# Patient Record
Sex: Male | Born: 1985 | Race: White | Hispanic: No | Smoking: Never smoker
Health system: Southern US, Community
[De-identification: ages and names within clinical notes are randomized; demographics above are authoritative.]

## PROBLEM LIST (undated history)

## (undated) DIAGNOSIS — E079 Disorder of thyroid, unspecified: Secondary | ICD-10-CM

---
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
## 2008-06-25 ENCOUNTER — Emergency Department (HOSPITAL_COMMUNITY): Admission: EM | Admit: 2008-06-25 | Discharge: 2008-06-25 | Payer: Self-pay | Admitting: Emergency Medicine

## 2010-12-24 ENCOUNTER — Emergency Department (HOSPITAL_BASED_OUTPATIENT_CLINIC_OR_DEPARTMENT_OTHER): Payer: Self-pay

## 2010-12-24 ENCOUNTER — Encounter: Payer: Self-pay | Admitting: *Deleted

## 2010-12-24 ENCOUNTER — Emergency Department (HOSPITAL_BASED_OUTPATIENT_CLINIC_OR_DEPARTMENT_OTHER)
Admission: EM | Admit: 2010-12-24 | Discharge: 2010-12-24 | Disposition: A | Discharge status: Home / Self Care | Payer: Self-pay | Attending: Emergency Medicine | Admitting: Emergency Medicine

## 2010-12-24 DIAGNOSIS — R05 Cough: Secondary | ICD-10-CM | POA: Insufficient documentation

## 2010-12-24 DIAGNOSIS — J069 Acute upper respiratory infection, unspecified: Secondary | ICD-10-CM | POA: Insufficient documentation

## 2010-12-24 DIAGNOSIS — R059 Cough, unspecified: Secondary | ICD-10-CM | POA: Insufficient documentation

## 2010-12-24 DIAGNOSIS — J45909 Unspecified asthma, uncomplicated: Secondary | ICD-10-CM | POA: Insufficient documentation

## 2010-12-24 MED ORDER — BENZONATATE 100 MG PO CAPS: 100.0000 mg | ORAL_CAPSULE | Freq: Three times a day (TID) | ORAL | Status: AC

## 2010-12-24 NOTE — ED Notes (Signed)
Patient began having cold symptoms last Sunday, now has a productive cough. Denies fever.

## 2010-12-24 NOTE — ED Provider Notes (Signed)
History     CSN: [id number] Arrival date & time: 12/24/2010  1:55 AM   First MD Initiated Contact with Patient 12/24/10 0158      Chief Complaint  Patient presents with  . Cough    (Consider location/radiation/quality/duration/timing/severity/associated sxs/prior treatment) Patient is a 25 y.o. male presenting with cough. The history is provided by the patient.  Cough   patient here with cough and congestion for a week. No recent fever, vomiting, diarrhea. No sore throat or ear pain. Notes he has a daughter at home who has similar symptoms. Has been using over-the-counter medications with limited relief. Denies shortness of breath, cough is worse at night. Nothing makes his cough better and it has been nonproductive  Past Medical History  Diagnosis Date  . Asthma     History reviewed. No pertinent past surgical history.  No family history on file.  History  Substance Use Topics  . Smoking status: Never Smoker   . Smokeless tobacco: Not on file  . Alcohol Use:       Review of Systems  Respiratory: Positive for cough.   All other systems reviewed and are negative.    Allergies  Augmentin and Penicillins  Home Medications  No current outpatient prescriptions on file.  BP 145/87  Pulse 97  Temp(Src) 98.4 F (36.9 C) (Oral)  Resp 20  SpO2 100%  Physical Exam  Nursing note and vitals reviewed. Constitutional: He is oriented to person, place, and time. Vital signs are normal. He appears well-developed and well-nourished.  Non-toxic appearance.  HENT:  Head: Normocephalic and atraumatic.  Eyes: Conjunctivae are normal. Pupils are equal, round, and reactive to light.  Neck: Normal range of motion.  Cardiovascular: Normal rate.   Pulmonary/Chest: Effort normal.  Neurological: He is alert and oriented to person, place, and time.  Skin: Skin is warm and dry.  Psychiatric: He has a normal mood and affect.    ED Course  Procedures (including critical care  time)  Labs Reviewed - No data to display No results found.   No diagnosis found.    MDM  Patient symptoms consistent with URI will give patient prescription for Jerilynn Som and he will see his Dr. as needed        Toy Baker, MD 12/24/10 [phone]

## 2011-01-17 ENCOUNTER — Encounter (HOSPITAL_BASED_OUTPATIENT_CLINIC_OR_DEPARTMENT_OTHER): Payer: Self-pay | Admitting: *Deleted

## 2011-01-17 ENCOUNTER — Emergency Department (INDEPENDENT_AMBULATORY_CARE_PROVIDER_SITE_OTHER): Payer: Self-pay

## 2011-01-17 ENCOUNTER — Emergency Department (HOSPITAL_BASED_OUTPATIENT_CLINIC_OR_DEPARTMENT_OTHER)
Admission: EM | Admit: 2011-01-17 | Discharge: 2011-01-18 | Disposition: A | Discharge status: Home / Self Care | Payer: Self-pay | Attending: Emergency Medicine | Admitting: Emergency Medicine

## 2011-01-17 DIAGNOSIS — R059 Cough, unspecified: Secondary | ICD-10-CM

## 2011-01-17 DIAGNOSIS — R05 Cough: Secondary | ICD-10-CM

## 2011-01-17 DIAGNOSIS — J40 Bronchitis, not specified as acute or chronic: Secondary | ICD-10-CM | POA: Insufficient documentation

## 2011-01-17 DIAGNOSIS — J45909 Unspecified asthma, uncomplicated: Secondary | ICD-10-CM | POA: Insufficient documentation

## 2011-01-17 NOTE — ED Notes (Signed)
Pt states that he was seen 5 weeks ago for same sx and that he was sx with viral illness /URI states that sx are still present and no better pt with cough congestion

## 2011-01-18 MED ORDER — AZITHROMYCIN 250 MG PO TABS: ORAL_TABLET | ORAL | Status: AC

## 2011-01-18 NOTE — ED Provider Notes (Signed)
History     CSN: [id number] Arrival date & time: 01/17/2011 11:37 PM   First MD Initiated Contact with Patient 01/18/11 0017      Chief Complaint  Patient presents with  . Cough    (Consider location/radiation/quality/duration/timing/severity/associated sxs/prior treatment) Patient is a 25 y.o. male presenting with cough. The history is provided by the patient.  Cough Pertinent negatives include no chest pain, no headaches, no sore throat, no shortness of breath and no eye redness.  pt c/o productive cough, greenish sputum for past few weeks. Subjective fever. Nasal congestion. No sore throat. No headache or body aches. No known ill contacts. No chest pain or sob. Hx bronchitis. Non smoker.   Past Medical History  Diagnosis Date  . Asthma     History reviewed. No pertinent past surgical history.  History reviewed. No pertinent family history.  History  Substance Use Topics  . Smoking status: Never Smoker   . Smokeless tobacco: Not on file  . Alcohol Use: No      Review of Systems  Constitutional: Positive for fever.  HENT: Negative for sore throat and neck pain.   Eyes: Negative for redness.  Respiratory: Positive for cough. Negative for shortness of breath.   Cardiovascular: Negative for chest pain.  Gastrointestinal: Negative for abdominal pain.  Genitourinary: Negative for flank pain.  Musculoskeletal: Negative for back pain.  Skin: Negative for rash.  Neurological: Negative for headaches.  Hematological: Does not bruise/bleed easily.  Psychiatric/Behavioral: Negative for confusion.    Allergies  Augmentin and Penicillins  Home Medications   Current Outpatient Rx  Name Route Sig Dispense Refill  . VITAMIN C 1000 MG PO TABS Oral Take 1,000 mg by mouth daily.        BP 127/75  Pulse 99  Temp(Src) 98.2 F (36.8 C) (Oral)  Resp 20  SpO2 99%  Physical Exam  Nursing note and vitals reviewed. Constitutional: He is oriented to person, place, and  time. He appears well-developed and well-nourished. No distress.  HENT:  Head: Atraumatic.  Mouth/Throat: Oropharynx is clear and moist.  Eyes: Pupils are equal, round, and reactive to light.  Neck: Neck supple. No tracheal deviation present.  Cardiovascular: Normal rate, regular rhythm, normal heart sounds and intact distal pulses.  Exam reveals no gallop and no friction rub.   No murmur heard. Pulmonary/Chest: Effort normal. No accessory muscle usage. No respiratory distress. He has no wheezes.       Coughing, upper resp congestion.   Abdominal: He exhibits no distension.  Musculoskeletal: He exhibits no edema and no tenderness.  Neurological: He is alert and oriented to person, place, and time.  Skin: Skin is warm and dry.  Psychiatric: He has a normal mood and affect.    ED Course  Procedures (including critical care time)  Labs Reviewed - No data to display Dg Chest 2 View  01/18/2011  *RADIOLOGY REPORT*  Clinical Data:  Cough.  CHEST - 2 VIEW  Comparison: 06/25/2008  Findings: The heart size and mediastinal contours are within normal limits.  Both lungs are clear.  The visualized skeletal structures are unremarkable.  IMPRESSION: No active disease.  Original Report Authenticated By: Reola Calkins, M.D.        MDM  Progressive cough x few weeks duration, subj fever. Will rx zithromax. Confirmed w pt only allergies are pcn, augmentin.       Suzi Roots, MD 01/18/11 [facility] Manners

## 2016-01-04 DIAGNOSIS — R002 Palpitations: Secondary | ICD-10-CM | POA: Diagnosis present

## 2016-01-04 DIAGNOSIS — J45909 Unspecified asthma, uncomplicated: Secondary | ICD-10-CM | POA: Insufficient documentation

## 2016-01-04 DIAGNOSIS — T450X5A Adverse effect of antiallergic and antiemetic drugs, initial encounter: Secondary | ICD-10-CM | POA: Diagnosis not present

## 2016-01-04 LAB — CBC
HEMATOCRIT: 41.8 % (ref 39.0–52.0)
HEMOGLOBIN: 13.7 g/dL (ref 13.0–17.0)
MCH: 25.5 pg — ABNORMAL LOW (ref 26.0–34.0)
MCHC: 32.8 g/dL (ref 30.0–36.0)
MCV: 77.8 fL — ABNORMAL LOW (ref 78.0–100.0)
Platelets: 297 10*3/uL (ref 150–400)
RBC: 5.37 MIL/uL (ref 4.22–5.81)
RDW: 15.5 % (ref 11.5–15.5)
WBC: 9.6 10*3/uL (ref 4.0–10.5)

## 2016-01-04 LAB — BASIC METABOLIC PANEL
ANION GAP: 9 (ref 5–15)
BUN: 11 mg/dL (ref 6–20)
CALCIUM: 9.4 mg/dL (ref 8.9–10.3)
CO2: 24 mmol/L (ref 22–32)
Chloride: 105 mmol/L (ref 101–111)
Creatinine, Ser: 0.82 mg/dL (ref 0.61–1.24)
GFR calc Af Amer: >60 mL/min (ref >60)
Glucose, Bld: 113 mg/dL — ABNORMAL HIGH (ref 65–99)
POTASSIUM: 3.5 mmol/L (ref 3.5–5.1)
SODIUM: 138 mmol/L (ref 135–145)

## 2016-01-04 NOTE — ED Triage Notes (Signed)
Pt took Xyzal at 2030. Around 2140, pt felt palpitations and lightheaded. Pt feels that palpitations have subsided, still feels flushed. Pt now feeling drowsy

## 2016-01-05 ENCOUNTER — Emergency Department (HOSPITAL_COMMUNITY)
Admission: EM | Admit: 2016-01-05 | Discharge: 2016-01-05 | Disposition: A | Discharge status: Home / Self Care | Payer: BLUE CROSS/BLUE SHIELD | Attending: Emergency Medicine | Admitting: Emergency Medicine

## 2016-01-05 DIAGNOSIS — T50905A Adverse effect of unspecified drugs, medicaments and biological substances, initial encounter: Secondary | ICD-10-CM

## 2016-01-05 LAB — TROPONIN I: Troponin I: <0.03 ng/mL (ref <0.03)

## 2016-01-05 NOTE — ED Provider Notes (Signed)
[facility]EMERGENCY DEPT Provider Note   CSN: [id number] Arrival date & time: 01/04/16  2209   History   Chief Complaint Chief Complaint  Patient presents with  . Palpitations  . Medication Reaction    HPI Cameron Soto is a 30 y.o. male.  HPI   Patient to the ER with PMH of asthma. He went to the allergist on 01/04/2016 and it was recommended to him that he try Xyzal. The patient decided to take it around 8:30 pm this evening, the recommended dosage when approx 15 minutes afterwards he developed heart racing, feeling flushed and faint. It lasted approx 30 minutes, he drank a lot of water and it resolved. He has allergies to environmental allergens and denies ever having had this happened in the past. He denies N/V/D, syncope, CP, SOB, rash. Pt back to baseline on arrival.  Past Medical History:  Diagnosis Date  . Asthma     There are no active problems to display for this patient.   No past surgical history on file.     Home Medications    Prior to Admission medications   Medication Sig Start Date End Date Taking? Authorizing Provider  albuterol (PROVENTIL HFA;VENTOLIN HFA) 108 (90 Base) MCG/ACT inhaler Inhale 1-2 puffs into the lungs every 6 (six) hours as needed for wheezing or shortness of breath.   Yes Historical Provider, MD  Ascorbic Acid (VITAMIN C) 1000 MG tablet Take 1,000 mg by mouth daily.     Yes Historical Provider, MD  fluticasone (FLONASE) 50 MCG/ACT nasal spray Place 2 sprays into both nostrils daily. 10/22/15  Yes Historical Provider, MD  levocetirizine (XYZAL) 5 MG tablet Take 5 mg by mouth once.   Yes Historical Provider, MD  levothyroxine (SYNTHROID, LEVOTHROID) 50 MCG tablet Take 50 mcg by mouth daily. 11/07/15  Yes Historical Provider, MD  loratadine (CLARITIN) 10 MG tablet Take 10 mg by mouth daily as needed for allergies.   Yes Historical Provider, MD  Multiple Vitamin (MULTIVITAMIN WITH MINERALS) TABS tablet Take 1 tablet by mouth daily.   Yes  Historical Provider, MD  omeprazole (PRILOSEC) 20 MG capsule Take 20 mg by mouth daily. 01/01/16  Yes Historical Provider, MD  Red Yeast Rice Extract (RED YEAST RICE PO) Take 1 tablet by mouth daily.   Yes Historical Provider, MD    Family History No family history on file.  Social History Social History  Substance Use Topics  . Smoking status: Never Smoker  . Smokeless tobacco: Not on file  . Alcohol use No     Allergies   Amoxicillin-pot clavulanate and Penicillins   Review of Systems Review of Systems Review of Systems All other systems negative except as documented in the HPI. All pertinent positives and negatives as reviewed in the HPI.   Physical Exam Updated Vital Signs BP 137/94   Pulse 88   Temp 98.5 F (36.9 C) (Oral)   Resp 14   SpO2 98%   Physical Exam  Constitutional: He appears well-developed and well-nourished. No distress.  HENT:  Head: Normocephalic and atraumatic.  Right Ear: Tympanic membrane and ear canal normal.  Left Ear: Tympanic membrane and ear canal normal.  Nose: Nose normal.  Mouth/Throat: Uvula is midline, oropharynx is clear and moist and mucous membranes are normal.  Eyes: Pupils are equal, round, and reactive to light.  Neck: Normal range of motion. Neck supple.  Cardiovascular: Normal rate and regular rhythm.   Pulmonary/Chest: Effort normal.  Abdominal: Soft.  No signs of abdominal distention  Musculoskeletal:  No LE swelling  Neurological: He is alert.  Acting at baseline  Skin: Skin is warm and dry. No rash noted.  Nursing note and vitals reviewed.    ED Treatments / Results  Labs (all labs ordered are listed, but only abnormal results are displayed) Labs Reviewed  BASIC METABOLIC PANEL - Abnormal; Notable for the following:       Result Value   Glucose, Bld 113 (*)    All other components within normal limits  CBC - Abnormal; Notable for the following:    MCV 77.8 (*)    MCH 25.5 (*)    All other components  within normal limits  TROPONIN I  I-STAT TROPOININ, ED    EKG  EKG Interpretation None       Radiology No results found.  Procedures Procedures (including critical care time)  Medications Ordered in ED Medications - No data to display   Initial Impression / Assessment and Plan / ED Course  I have reviewed the triage vital signs and the nursing notes.  Pertinent labs & imaging results that were available during my care of the patient were reviewed by me and considered in my medical decision making (see chart for details).  Clinical Course     Patient in waiting room for prolonged period of time, at time of discharge the patient has been symptoms free for 6 hours. Advised not to take Xyzal anymore. Discussed return precautions and that he call the allergist and let them know about his reaction to the medication.  Vitals:   01/04/16 2223 01/05/16 0053 01/05/16 0230  BP: 156/98 139/82 137/94  Pulse: 107 98 88  Resp: 18 18 14   Temp: 98.1 F (36.7 C) 98.5 F (36.9 C)   TempSrc: Oral Oral   SpO2: 98% 97% 98%     Final Clinical Impressions(s) / ED Diagnoses   Final diagnoses:  Adverse effect of drug, initial encounter    New Prescriptions New Prescriptions   No medications on file     Marlon Peliffany Tanara Turvey, PA-C 01/05/16 0304    Glynn OctaveStephen Rancour, MD 01/05/16 [phone]

## 2016-01-05 NOTE — ED Notes (Signed)
Patient stated that he feels better then when he came..Cameron Soto

## 2016-04-17 ENCOUNTER — Other Ambulatory Visit: Payer: Self-pay | Admitting: Family Medicine

## 2016-04-17 ENCOUNTER — Ambulatory Visit
Admission: RE | Admit: 2016-04-17 | Discharge: 2016-04-17 | Disposition: A | Discharge status: Home / Self Care | Payer: BLUE CROSS/BLUE SHIELD | Source: Ambulatory Visit | Attending: Family Medicine | Admitting: Family Medicine

## 2016-04-17 DIAGNOSIS — R0789 Other chest pain: Secondary | ICD-10-CM

## 2016-04-30 ENCOUNTER — Telehealth: Payer: Self-pay

## 2016-04-30 NOTE — Telephone Encounter (Signed)
SENT NOTES TO SCHEDULING 

## 2016-05-01 ENCOUNTER — Ambulatory Visit (INDEPENDENT_AMBULATORY_CARE_PROVIDER_SITE_OTHER): Payer: BLUE CROSS/BLUE SHIELD

## 2016-05-01 ENCOUNTER — Encounter: Payer: Self-pay | Admitting: *Deleted

## 2016-05-01 DIAGNOSIS — R002 Palpitations: Secondary | ICD-10-CM

## 2016-05-24 ENCOUNTER — Encounter: Payer: Self-pay | Admitting: Cardiovascular Disease

## 2016-06-10 ENCOUNTER — Telehealth: Payer: Self-pay | Admitting: Cardiovascular Disease

## 2016-06-10 NOTE — Telephone Encounter (Signed)
New message   Patient c/o Palpitations:  High priority if patient c/o lightheadedness and shortness of breath.  1. How long have you been having palpitations? Since 4/22 yesterday at 4:30pm  2. Are you currently experiencing lightheadedness and shortness of breath? No sob or lightheadnedness heart rate increased to 122  3. Have you checked your BP and heart rate? (document readings) heart rate 122, did not check bp  4. Are you experiencing any other symptoms? Took awhile for pt to walk because the palps startled him   New pt appt on Wednesday wants to know if he should wait or come sooner.Marland KitchenMarland Kitchen

## 2016-06-10 NOTE — Telephone Encounter (Signed)
Spoke with patient who has a new patient appointment with Dr. Elease Hashimoto on Wed. 4/25. He called to ask if he should come in sooner for fast heart rate and "fluttering."  In January, he states he started noticing that his heart is racing intermittently and fluttering. States he started making changes to his diet and has lost 25 lbs since January. He wore a heart monitor which was reported last week; it did not show anything significant. He states he has tried to eliminate potential causes including caffeine and red yeast rice. He states he has felt better for periods of times, but yesterday at at birthday party he felt his heart racing and felt like he might pass out. States his heart rate was approximately 122 bpm. He states he felt nauseous the night before but no vomiting or diarrhea. Reports recent BP 136/71 mmHg. He states yesterday he drank pedialyte which seemed to help his heart rate but again he felt nauseated. He denies complaints today. States HR was 60 bpm when he woke up this morning and later in the day was 106 bpm. States TSH last checked in January and result was 0.95 with reference range 0.34 to 4.50; states last ekg at that time was "normal." He denies SOB or chest pain with these episodes but states he did have a slight chest pain yesterday. He recently stopped taking omeprazole which he had taken for 5 years. We discussed diet and he reports eating a fairly healthy diet. I encouraged him to eat more protein in the morning and to try drinking a V8 in the place of the electrolyte drink he has been drinking daily. I advised that his symptoms seem consistent with waiting for his appointment on Wednesday. He verbalized understanding and agreement with plan and thanked me for the call.

## 2016-06-11 ENCOUNTER — Telehealth: Payer: Self-pay | Admitting: Physician Assistant

## 2016-06-11 NOTE — Telephone Encounter (Signed)
Agree with note by Michelle Swinyer, RN  

## 2016-06-11 NOTE — Telephone Encounter (Signed)
Pt was at work today and had been standing for a while.  He began to feel bad and checked BP/HR. BP was 148/90 then and 155/92 on recheck after resting. HR was 122 at times, not much lower later.  He was concerned that he would have a stroke or something from the elevated BP. He had no CP, no SOB, no presyncope. He was a little aware of the elevated HR but not particularly symptomatic.   Advised pt BP not high enough to cause a stroke. Advised it would be a good idea to see what the rhythm is since his HR is elevated but cannot do that without coming to the ER or UC. He does not wish to do that.  Will go home and rest. Keep appt with Dr Elease Hashimoto tomorrow. Call if any more symptoms.  Pt agreeable.  Leanna Battles 06/11/2016 8:37 PM Beeper [phone]

## 2016-06-12 ENCOUNTER — Encounter: Payer: Self-pay | Admitting: Cardiovascular Disease

## 2016-06-12 ENCOUNTER — Telehealth: Payer: Self-pay | Admitting: Cardiovascular Disease

## 2016-06-12 ENCOUNTER — Ambulatory Visit (INDEPENDENT_AMBULATORY_CARE_PROVIDER_SITE_OTHER): Payer: BLUE CROSS/BLUE SHIELD | Admitting: Cardiovascular Disease

## 2016-06-12 ENCOUNTER — Other Ambulatory Visit: Payer: Self-pay | Admitting: Cardiovascular Disease

## 2016-06-12 VITALS — BP 124/90 | HR 105 | Ht 66.0 in | Wt 209.4 lb

## 2016-06-12 DIAGNOSIS — R002 Palpitations: Secondary | ICD-10-CM | POA: Diagnosis not present

## 2016-06-12 MED ORDER — METOPROLOL TARTRATE 25 MG PO TABS: 25.0000 mg | ORAL_TABLET | Freq: Every day | ORAL | 11 refills | Status: DC

## 2016-06-12 NOTE — Telephone Encounter (Signed)
Was addressed in his office visit today

## 2016-06-12 NOTE — Telephone Encounter (Signed)
New Message   Pt c/o medication issue:  1. Name of Medication:   metoprolol tartrate (LOPRESSOR) 25 MG tablet   2. How are you currently taking this medication (dosage and times per day)? Hasn't started  3. Are you having a reaction (difficulty breathing--STAT)? No  4. What is your medication issue? Wants to make sure that medication won't drop his BP down too low, when he is resting or asleep.

## 2016-06-12 NOTE — Telephone Encounter (Signed)
Left message on patient's voice mail that medication should not cause significant decrease in BP and to monitor. I had reviewed symptoms of hypotension while the patient was in the office today. I advised him to call back with additional questions or concerns.

## 2016-06-12 NOTE — Patient Instructions (Addendum)
Medication Instructions:  START Metoprolol (Lopressor) 25 mg once daily   Labwork: None Ordered    Testing/Procedures: None Ordered    Follow-Up: Your physician recommends that you schedule a follow-up appointment in: 3-4 months with Dr. Elease Hashimoto   If you need a refill on your cardiac medications before your next appointment, please call your pharmacy.   Thank you for choosing [facility]! Eligha Bridegroom, RN [phone]

## 2016-06-12 NOTE — Progress Notes (Signed)
Cardiology Office Note:    Date:  06/12/2016   ID:  Cameron Soto, DOB Mar 24, 1985, MRN [id number]  PCP:  Cameron Blamer, MD  Cardiologist:  Cameron Miss, MD   Electrophysiologist:    Referring MD: Cameron Blamer, MD    Problem List : 1. Palpitations 2. Asthma  3. Hypothyroidism   Chief Complaint  Patient presents with  . Palpitations    History of Present Illness:    Cameron Soto is a 31 y.o. male with a hx of palpitations . He is seen today at the request of Dr. Tiburcio Soto for palpitations.   I saw Cameron Soto about 12 years ago for palpitations  4 months ago- developed palpitations , not associated with exertion. Seem to be worse in the evening when he sits down to rest. resoved,  Returned in February .  Now off and on, no associated CP , dyspnea, dizziness. Has had some vertigo but did not seem to be related to these palpitations .  Wore a 30 day monitor.   Had episodes of sinus tach and sinus brady with rare PVCs.   Has had some BP elevations recently . Having some stomach issues ( seems to have palpitations shortly before needing to have a bowel movement )   Had more palpitations last night - HR up to the 120s No syncope, no CP or dspnea  Snores, does not know if has sleep apnea.  Has stopped during coffee.   Does not drink sodas regularly   TSH  was normal in January. Has lost about 30 lbs over the past 4 months .  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    Also works at a Group home.   Has lots of stress.     Past Medical History:  Diagnosis Date  . Asthma     No past surgical history on file.  Current Medications: Current Meds  Medication Sig  . albuterol (PROVENTIL HFA;VENTOLIN HFA) 108 (90 Base) MCG/ACT inhaler Inhale 1-2 puffs into the lungs every 6 (six) hours as needed for wheezing or shortness of breath.  . Ascorbic Acid (VITAMIN C) 1000 MG tablet Take 1,000 mg by mouth daily.    . fluticasone (FLONASE) 50 MCG/ACT  nasal spray Place 2 sprays into both nostrils daily.  Marland Kitchen levothyroxine (SYNTHROID, LEVOTHROID) 50 MCG tablet Take 50 mcg by mouth daily.  Marland Kitchen loratadine (CLARITIN) 10 MG tablet Take 10 mg by mouth daily as needed for allergies.  . Multiple Vitamin (MULTIVITAMIN WITH MINERALS) TABS tablet Take 1 tablet by mouth daily.     Allergies:   Amoxicillin-pot clavulanate; Other; and Penicillins   Social History   Social History  . Marital status: Married    Spouse name: N/A  . Number of children: N/A  . Years of education: N/A   Social History Main Topics  . Smoking status: Never Smoker  . Smokeless tobacco: Never Used  . Alcohol use No  . Drug use: No  . Sexual activity: Not Asked   Other Topics Concern  . None   Social History Narrative  . None     family history includes Heart disease in his maternal grandfather; Hypertension in his mother. ROS:   Please see the history of present illness.     All other systems reviewed and are negative.     Recent Labs: 01/04/2016: BUN 11; Creatinine, Ser 0.82; Hemoglobin 13.7; Platelets 297; Potassium 3.5; Sodium 138   Recent Lipid Panel No results  found for: CHOL, TRIG, HDL, CHOLHDL, VLDL, LDLCALC, LDLDIRECT  Physical Exam:    VS:  BP 124/90 (BP Location: Left Arm, Patient Position: Sitting, Cuff Size: Normal)   Pulse (!) 105   Ht  (1.676 m)   Wt 209 lb 6.4 oz (95 kg)   SpO2 94%   BMI 33.80 kg/m     Wt Readings from Last 3 Encounters:  06/12/16 209 lb 6.4 oz (95 kg)     GEN:  Well nourished, mildly obese, , well developed in no acute distress HEENT: Normal NECK: No JVD; No carotid bruits LYMPHATICS: No lymphadenopathy CARDIAC: RRR, no murmurs, rubs, gallops RESPIRATORY:  Clear to auscultation without rales, wheezing or rhonchi  ABDOMEN: Soft, non-tender, non-distended MUSCULOSKELETAL:  No edema; No deformity  SKIN: Warm and dry NEUROLOGIC:  Alert and oriented x 3 PSYCHIATRIC:  Normal affect   EKGs/Labs/Other Studies  Reviewed:    EKG:  EKG is  ordered today.  The ekg ordered today June 12, 2016:     Sinus tach at 105.   No ST or T wave changes.     ASSESSMENT:    No diagnosis found. PLAN:    In order of problems listed above:  1. Palpitations:   Has sinus tachycardia  at baseline. Also has PVCs Will start metoprolol  25 mg a day .  He has some episodes of bradycardia at night so we will just give it once a day in the AM . Will see him in  3-4 months for follow   I have reassured him that his palpitations are benign. encouraged him to continue with weight loss.    2. Weight loss:  This has been purposeful.   TSH is normal. Encouraged him to exercise more         Medication Adjustments/Labs and Tests Ordered: Current medicines are reviewed at length with the patient today.  Concerns regarding medicines are outlined above. Labs and tests ordered and medication changes are outlined in the patient instructions below:  There are no Patient Instructions on file for this visit.   Signed, Cameron Miss, MD  06/12/2016 10:46 AM    [facility]

## 2016-06-14 ENCOUNTER — Telehealth: Payer: Self-pay | Admitting: Cardiovascular Disease

## 2016-06-14 NOTE — Telephone Encounter (Signed)
Spoke with patient who called to ask if he can have further testing on his heart. He states he is hesitant to start the metoprolol because he is afraid his heart rate is too low at night and during sleep to tolerate the medication. He wants to know if there are any tests that can be performed that could tell him if something is wrong with his heart. I advised that per Dr. Elease Hashimoto, he does not see any need to order additional testing at this time.  I advised that the event monitor did not indicate high PVC burden and patient did not have any other symptoms that he thought required additional testing. I advised him that Dr. Harvie Bridge advice was to monitor symptoms with the addition of metoprolol and to work on better diet and exercise. I advised that the dose we have prescribed is a low dose of the short acting metoprolol and if he takes it early in the day, it should not interfere with his heart rate while sleeping. He states he has restarted CoQ 10 and red yeast rice to see if this helps with heart rate and BP and he plans to start exercising. He asks if he can see if these things improve his symptoms before starting the metoprolol. He states he is concerned about anxiety with new medications as this happened in the past when he start Xyzal. 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 I advised him to continue to monitor and to call back with questions or concerns. He verbalized understanding and agreement and thanked me for the call.

## 2016-06-14 NOTE — Telephone Encounter (Signed)
New message      Pt c/o medication issue:  1. Name of Medication: metoprolol 2. How are you currently taking this medication (dosage and times per day)?   3. Are you having a reaction (difficulty breathing--STAT)? no  4. What is your medication issue?  Pt is calling to let the doctor know that he want to wait about 1 month to start the metoprolol. He did not pick up pres---pharmacy will put it on hold. He stopped red yeast rice for 1 month prior and think this may have been helping his bp.  He restarted red yeast rice today and started coQ10 today.  He states that he is going to walk daily. Pt want to re-evaluate his bp in a month and see if he can keep from having to start the metoprolol.  Also, pt has decided to go ahead with the test discussed at last ov.  He thinks it was a stress test and an echo but is not sure.

## 2016-06-14 NOTE — Telephone Encounter (Signed)
Agree with not by Eligha Bridegroom, RN. Will continue to monitor on low dose metoprolol ( advised him to take in the AM to hopefully avoid nighttime bradycardia

## 2016-10-14 ENCOUNTER — Ambulatory Visit: Payer: BLUE CROSS/BLUE SHIELD | Admitting: Cardiovascular Disease

## 2017-04-17 ENCOUNTER — Emergency Department (HOSPITAL_BASED_OUTPATIENT_CLINIC_OR_DEPARTMENT_OTHER): Payer: BLUE CROSS/BLUE SHIELD

## 2017-04-17 ENCOUNTER — Encounter (HOSPITAL_BASED_OUTPATIENT_CLINIC_OR_DEPARTMENT_OTHER): Payer: Self-pay

## 2017-04-17 ENCOUNTER — Emergency Department (HOSPITAL_BASED_OUTPATIENT_CLINIC_OR_DEPARTMENT_OTHER)
Admission: EM | Admit: 2017-04-17 | Discharge: 2017-04-17 | Disposition: A | Discharge status: Home / Self Care | Payer: BLUE CROSS/BLUE SHIELD | Attending: Emergency Medicine | Admitting: Emergency Medicine

## 2017-04-17 DIAGNOSIS — R0789 Other chest pain: Secondary | ICD-10-CM | POA: Diagnosis not present

## 2017-04-17 DIAGNOSIS — Z79899 Other long term (current) drug therapy: Secondary | ICD-10-CM | POA: Insufficient documentation

## 2017-04-17 DIAGNOSIS — M25512 Pain in left shoulder: Secondary | ICD-10-CM | POA: Diagnosis not present

## 2017-04-17 DIAGNOSIS — J45909 Unspecified asthma, uncomplicated: Secondary | ICD-10-CM | POA: Diagnosis not present

## 2017-04-17 HISTORY — DX: Disorder of thyroid, unspecified: E07.9

## 2017-04-17 LAB — COMPREHENSIVE METABOLIC PANEL
ALT: 35 U/L (ref 17–63)
ANION GAP: 10 (ref 5–15)
AST: 26 U/L (ref 15–41)
Albumin: 4.1 g/dL (ref 3.5–5.0)
Alkaline Phosphatase: 66 U/L (ref 38–126)
BILIRUBIN TOTAL: 0.4 mg/dL (ref 0.3–1.2)
BUN: 15 mg/dL (ref 6–20)
CHLORIDE: 102 mmol/L (ref 101–111)
CO2: 25 mmol/L (ref 22–32)
Calcium: 9.2 mg/dL (ref 8.9–10.3)
Creatinine, Ser: 0.93 mg/dL (ref 0.61–1.24)
GFR calc Af Amer: >60 mL/min (ref >60)
GLUCOSE: 108 mg/dL — AB (ref 65–99)
Potassium: 3.7 mmol/L (ref 3.5–5.1)
SODIUM: 137 mmol/L (ref 135–145)
TOTAL PROTEIN: 7.5 g/dL (ref 6.5–8.1)

## 2017-04-17 LAB — CBC
HCT: 46.1 % (ref 39.0–52.0)
Hemoglobin: 15.6 g/dL (ref 13.0–17.0)
MCH: 26.9 pg (ref 26.0–34.0)
MCHC: 33.8 g/dL (ref 30.0–36.0)
MCV: 79.5 fL (ref 78.0–100.0)
PLATELETS: 240 10*3/uL (ref 150–400)
RBC: 5.8 MIL/uL (ref 4.22–5.81)
RDW: 14.8 % (ref 11.5–15.5)
WBC: 6.2 10*3/uL (ref 4.0–10.5)

## 2017-04-17 LAB — TROPONIN I

## 2017-04-17 LAB — LIPASE, BLOOD: LIPASE: 46 U/L (ref 11–51)

## 2017-04-17 NOTE — Discharge Instructions (Signed)
Read instructions below for reasons to return to the Emergency Department. It is recommended that your follow up with your Primary Care Doctor in regards to today's visit. If you do not have a doctor, use the resource guide listed below to help you find one. *  Tests performed today include: An EKG of your heart A chest x-ray Cardiac enzymes - a blood test for heart muscle damage Blood counts and electrolytes Vital signs. See below for your results today.   Chest Pain (Nonspecific)  HOME CARE INSTRUCTIONS  For the next few days, avoid physical activities that bring on chest pain. Continue physical activities as directed.  Do not smoke cigarettes or drink alcohol until your symptoms are gone. If you do smoke, it is time to quit. You may receive instructions and counseling on how to stop smoking. Only take over-the-counter or prescription medicine for pain, discomfort, or fever as directed by your caregiver.  Follow your caregiver's suggestions for further testing if your chest pain does not go away.  Keep any follow-up appointments you made. If you do not go to an appointment, you could develop lasting (chronic) problems with pain. If there is any problem keeping an appointment, you must call to reschedule.  SEEK MEDICAL CARE IF:  You think you are having problems from the medicine you are taking. Read your medicine instructions carefully.  Your chest pain does not go away, even after treatment.  You develop a rash with blisters on your chest.  SEEK IMMEDIATE MEDICAL CARE IF:  You have increased chest pain or pain that spreads to your arm, neck, jaw, back, or belly (abdomen).  You develop shortness of breath, an increasing cough, or you are coughing up blood.  You have severe back or abdominal pain, feel sick to your stomach (nauseous) or throw up (vomit).  You develop severe weakness, fainting, or chills.  You have an oral temperature above 102 F (38.9 C), not controlled by medicine.  THIS  IS AN EMERGENCY. Do not wait to see if the pain will go away. Get medical help at once. Call your local emergency services (911 in [place] Do not drive yourself to the hospital. Additional Information:  Your vital signs today were: BP 122/82    Pulse 99    Temp 98.6 F (37 C) (Oral)    Resp 15    Ht 5\' 6"  (1.676 m)    Wt 85.7 kg (189 lb)    SpO2 100%    BMI 30.51 kg/m  If your blood pressure (BP) was elevated above 135/85 this visit, please have this repeated by your doctor within one month. ---------------

## 2017-04-17 NOTE — ED Provider Notes (Signed)
[facility] EMERGENCY DEPARTMENT Provider Note   CSN: [id number] Arrival date & time: 04/17/17  1447     History   Chief Complaint Chief Complaint  Patient presents with  . Chest Pain    HPI Cameron Soto is a 32 y.o. male with a history of thyroid disease (currently off medication since December), palpitations (followed by Dr[facility]burn [facility]), and asthma who presents the emergency department today for intermittent chest pains since Saturday, 04/12/2017.  Patient notes that he recently had a stretch of time off work where he stayed at home and started a new workout regimen.  He notes that he started lifting weights after a long time from ceasing exercise.  Friday while at rest he noticed a pain on the left side of his chest.  This was sharp in nature and felt went "through him" and lasted for approximately 1-2 seconds.  He notes this is happened intermittently over the last several days occurring approximately 1 time per day. There is no radiation of the pain into the patients back, jaw, neck, or either arm. This always occurs at rest and is not worsened by exertion or position changes.  He reports no associated nausea, emesis, diaphoresis or shortness of breath with this.  Patient notes that he also had one episode of left arm pain when he awoke several days ago.  He notes at this time he had an achy pain in his left shoulder that went down his arm and was worse with movement.  He states he had no chest pain at this time.  It was relieved after approximately 30 minutes with stretching a movement.  He is currently chest pain-free.  He has not taken anything for symptoms.  The patient is followed by [facility] for history of palpitations.  He is previously wore a 30-day monitor that shows episodes of sinus bradycardia with rare PVCs.  He notes he has been without episodes of palpitations since onset of his symptoms.  He takes metoprolol 25 mg/day for palpitations and  denies history of hypertension.  Patient notes that he had a viral URI in early January but is otherwise been well.  No current chest pain or shoulder pain. Denies risk factors for DVT/PE including exogenous estrogen use, recent surgery or travel, trauma, immobilization, smoking, previous blood clot, cough, hemoptysis, cancer, lower extremity pain or swelling, or family history of bleeding/clotting disorder. No family history of cardiac disease. No previous heart cath, echo or stress tests.   HPI  Past Medical History:  Diagnosis Date  . Asthma   . Thyroid disease     Patient Active Problem List   Diagnosis Date Noted  . Palpitations 05/01/2016    History reviewed. No pertinent surgical history.     Home Medications    Prior to Admission medications   Medication Sig Start Date End Date Taking? Authorizing Provider  albuterol (PROVENTIL HFA;VENTOLIN HFA) 108 (90 Base) MCG/ACT inhaler Inhale 1-2 puffs into the lungs every 6 (six) hours as needed for wheezing or shortness of breath.    [provider]  Ascorbic Acid (VITAMIN C) 1000 MG tablet Take 1,000 mg by mouth daily.      [provider]  fluticasone (FLONASE) 50 MCG/ACT nasal spray Place 2 sprays into both nostrils daily. 10/22/15   [provider]  levothyroxine (SYNTHROID, LEVOTHROID) 50 MCG tablet Take 50 mcg by mouth daily. 11/07/15   [provider]  loratadine (CLARITIN) 10 MG tablet Take 10 mg  by mouth daily as needed for allergies.    [provider]  metoprolol tartrate (LOPRESSOR) 25 MG tablet Take 1 tablet (25 mg total) by mouth daily. 06/12/16   Nahser, Deloris PingPhilip J, MD  Multiple Vitamin (MULTIVITAMIN WITH MINERALS) TABS tablet Take 1 tablet by mouth daily.    [provider]  omeprazole (PRILOSEC) 20 MG capsule Take 20 mg by mouth daily. 01/01/16   [provider]  Red Yeast Rice Extract (RED YEAST RICE PO) Take 1 tablet by mouth daily.    [provider]      Family History Family History  Problem Relation Age of Onset  . Hypertension Mother   . Heart disease Maternal Grandfather     Social History Social History   Tobacco Use  . Smoking status: Never Smoker  . Smokeless tobacco: Never Used  Substance Use Topics  . Alcohol use: No  . Drug use: No     Allergies   Amoxicillin-pot clavulanate; Penicillins; and Xyzal [levocetirizine]   Review of Systems Review of Systems  All other systems reviewed and are negative.    Physical Exam Updated Vital Signs BP 113/78   Pulse 96   Temp 98.6 F (37 C) (Oral)   Resp 18   Ht 5\' 6"  (1.676 m)   Wt 85.7 kg (189 lb)   SpO2 95%   BMI 30.51 kg/m   Physical Exam  Constitutional: He appears well-developed and well-nourished.  HENT:  Head: Normocephalic and atraumatic.  Right Ear: External ear normal.  Left Ear: External ear normal.  Nose: Nose normal.  Mouth/Throat: Uvula is midline, oropharynx is clear and moist and mucous membranes are normal. No tonsillar exudate.  Eyes: Pupils are equal, round, and reactive to light. Right eye exhibits no discharge. Left eye exhibits no discharge. No scleral icterus.  Neck: Trachea normal. Neck supple. No JVD present. No spinous process tenderness present. Carotid bruit is not present. No neck rigidity. Normal range of motion present.  Cardiovascular: Normal rate, regular rhythm and intact distal pulses.  No murmur heard. Pulses:      Radial pulses are 2+ on the right side, and 2+ on the left side.       Dorsalis pedis pulses are 2+ on the right side, and 2+ on the left side.       Posterior tibial pulses are 2+ on the right side, and 2+ on the left side.  No lower extremity swelling or edema. Calves symmetric in size bilaterally.  Pulmonary/Chest: Effort normal and breath sounds normal. No respiratory distress. He exhibits tenderness.  No increased work of breathing. No accessory muscle use. Patient is sitting upright, speaking in full  sentences without difficulty     Abdominal: Soft. Bowel sounds are normal. There is no tenderness. There is no rebound and no guarding.  Musculoskeletal: He exhibits no edema.  Lymphadenopathy:    He has no cervical adenopathy.  Neurological: He is alert.  Skin: Skin is warm and dry. No rash noted. He is not diaphoretic.  Psychiatric: He has a normal mood and affect.  Nursing note and vitals reviewed.    ED Treatments / Results  Labs (all labs ordered are listed, but only abnormal results are displayed) Labs Reviewed  COMPREHENSIVE METABOLIC PANEL - Abnormal; Notable for the following components:      Result Value   Glucose, Bld 108 (*)    All other components within normal limits  CBC  TROPONIN I  LIPASE, BLOOD    EKG  EKG Interpretation  Date/Time:  Thursday April 17 2017 14:52:54 EST Ventricular Rate:  97 PR Interval:  142 QRS Duration: 92 QT Interval:  318 QTC Calculation: 403 R Axis:   95 Text Interpretation:  Normal sinus rhythm Rightward axis No significant change since last tracing Confirmed by Gwyneth Sprout ([id number]) on 04/17/2017 5:24:34 PM       Radiology Dg Chest 2 View  Result Date: 04/17/2017 CLINICAL DATA:  Acute chest pain for several days. EXAM: CHEST  2 VIEW COMPARISON:  04/17/2016 and prior chest radiographs dating back to 07/30/2003 FINDINGS: The cardiomediastinal silhouette is unremarkable. There is no evidence of focal airspace disease, pulmonary edema, suspicious pulmonary nodule/mass, pleural effusion, or pneumothorax. No acute bony abnormalities are identified. IMPRESSION: No active cardiopulmonary disease. Electronically Signed   By: Harmon Pier M.D.   On: 04/17/2017 15:14    Procedures Procedures (including critical care time)  Medications Ordered in ED Medications - No data to display   Initial Impression / Assessment and Plan / ED Course  I have reviewed the triage vital signs and the nursing notes.  Pertinent labs & imaging  results that were available during my care of the patient were reviewed by me and considered in my medical decision making (see chart for details).     32 y.o. male with intermittent episodes of left-sided chest pain without radiation that lasts for 1-2 seconds and not associated with nausea, emesis, diaphoresis or shortness of breath. Pain occurs at rest and is not worsened by exertion. Also notes one episode of left shoulder pain upon awakening that does not appear to be related to patient's chest pain. Recently started new workout regimen and is TTP along chest wall.   Patient is to be discharged with recommendation to follow up with PCP in regards to today's hospital visit. Chest pain is not likely of cardiac or pulmonary etiology due to presentation, wells and perc negative, stable vital signs (patient's HR in upper 90's, history of tachycardia followed by heart care), no tracheal deviation, no JVD or new murmur, RRR, breath sounds equal bilaterally, EKG without acute abnormalities, negative troponin, and negative CXR. HEART score is 1. Patient has been advised to return to the ED if chest pain becomes exertional, associated with diaphoresis or nausea, radiates to left jaw/arm, worsens or becomes concerning in any way. Patient's symptoms not consistent with thyroid strom. Atypical for dissection or esophageal rupture. Patient appears reliable for follow up and is agreeable to discharge. I advised the patient to follow-up with their primary care provider this week. He is to call his cardiology office tomorrow morning and inform them of today's visit. I advised the patient to return to the emergency department with new or worsening symptoms or new concerns. The patient verbalized understanding and agreement with plan. Patient stable   Final Clinical Impressions(s) / ED Diagnoses   Final diagnoses:  Atypical chest pain    ED Discharge Orders    None       Princella Pellegrini 04/18/17  1914    Gwyneth Sprout, MD 04/19/17 [phone]

## 2017-04-17 NOTE — ED Triage Notes (Signed)
Pt reports mild chest pain that comes and goes and started 2 days ago. Pt reports pain radiates down L arm. Pt denies associated symptoms.

## 2017-04-17 NOTE — ED Notes (Signed)
Pt verbalizes understanding of d/c instructions and denies any further needs at this time. 

## 2017-11-18 ENCOUNTER — Other Ambulatory Visit: Payer: Self-pay

## 2017-11-18 ENCOUNTER — Emergency Department (HOSPITAL_COMMUNITY)
Admission: EM | Admit: 2017-11-18 | Discharge: 2017-11-19 | Disposition: A | Discharge status: Home / Self Care | Payer: BLUE CROSS/BLUE SHIELD | Attending: Emergency Medicine | Admitting: Emergency Medicine

## 2017-11-18 ENCOUNTER — Emergency Department (HOSPITAL_COMMUNITY): Payer: BLUE CROSS/BLUE SHIELD

## 2017-11-18 DIAGNOSIS — J45909 Unspecified asthma, uncomplicated: Secondary | ICD-10-CM | POA: Insufficient documentation

## 2017-11-18 DIAGNOSIS — E039 Hypothyroidism, unspecified: Secondary | ICD-10-CM | POA: Diagnosis not present

## 2017-11-18 DIAGNOSIS — Z79899 Other long term (current) drug therapy: Secondary | ICD-10-CM | POA: Diagnosis not present

## 2017-11-18 DIAGNOSIS — R0789 Other chest pain: Secondary | ICD-10-CM | POA: Diagnosis not present

## 2017-11-18 LAB — BASIC METABOLIC PANEL
ANION GAP: 10 (ref 5–15)
BUN: 16 mg/dL (ref 6–20)
CO2: 27 mmol/L (ref 22–32)
Calcium: 9.9 mg/dL (ref 8.9–10.3)
Chloride: 104 mmol/L (ref 98–111)
Creatinine, Ser: 0.89 mg/dL (ref 0.61–1.24)
GFR calc Af Amer: >60 mL/min (ref >60)
Glucose, Bld: 105 mg/dL — ABNORMAL HIGH (ref 70–99)
POTASSIUM: 4.4 mmol/L (ref 3.5–5.1)
SODIUM: 141 mmol/L (ref 135–145)

## 2017-11-18 LAB — CBC
HEMATOCRIT: 47.3 % (ref 39.0–52.0)
Hemoglobin: 16 g/dL (ref 13.0–17.0)
MCH: 27.6 pg (ref 26.0–34.0)
MCHC: 33.8 g/dL (ref 30.0–36.0)
MCV: 81.7 fL (ref 78.0–100.0)
Platelets: 286 10*3/uL (ref 150–400)
RBC: 5.79 MIL/uL (ref 4.22–5.81)
RDW: 13.7 % (ref 11.5–15.5)
WBC: 8.3 10*3/uL (ref 4.0–10.5)

## 2017-11-18 LAB — POCT I-STAT TROPONIN I: Troponin i, poc: 0 ng/mL (ref 0.00–0.08)

## 2017-11-18 NOTE — ED Triage Notes (Signed)
Pt from home with c/o chest pain that started earlier today but he has had previously. Pt state he has heart flutters sometimes but his cardiologist said they are benign. Pt denies pain at this moment. Pt states he also had a pain shooting from his chest to his head earlier. 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 Pt reported having to use his inhaler, but this does not feel similar to his asthma CP

## 2017-11-18 NOTE — ED Notes (Signed)
Pt called to be triaged. Pt wanted to wait for family member who is in the restroom

## 2017-11-19 MED ORDER — NAPROXEN 500 MG PO TABS: 500.0000 mg | ORAL_TABLET | Freq: Two times a day (BID) | ORAL | 0 refills | Status: DC

## 2017-11-19 NOTE — ED Provider Notes (Signed)
[facility]-EMERGENCY DEPT Provider Note   CSN: [id number] Arrival date & time: 11/18/17  2059     History   Chief Complaint Chief Complaint  Patient presents with  . Chest Pain    HPI Terelle Dobler is a 32 y.o. male.  HPI  This is a 32 year old male who presents with chest pain.  Patient reports that while he was at work today he had intermittent left-sided sharp chest pain that radiated up to his head.  He has had pain in the past which is been in his chest and left arm.  He has had negative work-up.  Denies any recent cough, fevers, shortness of breath.  Nothing seems to make the pain better or worse.  It was specifically not exertional or related to food.  Denies any recent travel, history of blood clots, recent hospitalization or surgeries.  Denies leg swelling.  He does have a history of asthma but states that this does not feel like his asthma.  Past Medical History:  Diagnosis Date  . Asthma   . Thyroid disease     Patient Active Problem List   Diagnosis Date Noted  . Palpitations 05/01/2016    No past surgical history on file.      Home Medications    Prior to Admission medications   Medication Sig Start Date End Date Taking? Authorizing Provider  acidophilus (RISAQUAD) CAPS capsule Take 1 capsule by mouth daily.   Yes [provider]  albuterol (PROVENTIL HFA;VENTOLIN HFA) 108 (90 Base) MCG/ACT inhaler Inhale 1-2 puffs into the lungs every 6 (six) hours as needed for wheezing or shortness of breath.   Yes [provider]  Ascorbic Acid (VITAMIN C) 1000 MG tablet Take 1,000 mg by mouth daily.     Yes [provider]  co-enzyme Q-10 30 MG capsule Take 30 mg by mouth daily.   Yes [provider]  fexofenadine-pseudoephedrine (ALLEGRA-D 24) 180-240 MG 24 hr tablet Take 1 tablet by mouth daily.   Yes [provider]  fluticasone (FLONASE) 50 MCG/ACT nasal spray Place 2 sprays into both nostrils  daily as needed for allergies.  10/22/15  Yes [provider]  Multiple Vitamin (MULTIVITAMIN WITH MINERALS) TABS tablet Take 1 tablet by mouth daily.   Yes [provider]  Red Yeast Rice Extract (RED YEAST RICE PO) Take 1 tablet by mouth daily.   Yes [provider]  metoprolol tartrate (LOPRESSOR) 25 MG tablet Take 1 tablet (25 mg total) by mouth daily. Patient not taking: Reported on 11/18/2017 06/12/16   Nahser, Deloris Ping, MD  naproxen (NAPROSYN) 500 MG tablet Take 1 tablet (500 mg total) by mouth 2 (two) times daily. 11/19/17   Horton, Mayer Masker, MD    Family History Family History  Problem Relation Age of Onset  . Hypertension Mother   . Heart disease Maternal Grandfather     Social History Social History   Tobacco Use  . Smoking status: Never Smoker  . Smokeless tobacco: Never Used  Substance Use Topics  . Alcohol use: No  . Drug use: No     Allergies   Amoxicillin-pot clavulanate; Meloxicam; Other; Penicillins; and Xyzal [levocetirizine]   Review of Systems Review of Systems  Constitutional: Negative for fever.  Respiratory: Negative for cough and shortness of breath.   Cardiovascular: Positive for chest pain. Negative for leg swelling.  Gastrointestinal: Negative for abdominal pain, nausea and vomiting.  Genitourinary: Negative for dysuria.  All other systems reviewed and  are negative.    Physical Exam Updated Vital Signs BP 129/85   Pulse 98   Temp 98.2 F (36.8 C) (Oral)   Resp 12   SpO2 100%   Physical Exam  Constitutional: He is oriented to person, place, and time. He appears well-developed and well-nourished.  HENT:  Head: Normocephalic and atraumatic.  Neck: Normal range of motion. Neck supple.  Cardiovascular: Normal rate, regular rhythm, normal heart sounds and normal pulses.  No murmur heard. Pulmonary/Chest: Effort normal and breath sounds normal. No respiratory distress. He has no wheezes.  Abdominal: Soft. Bowel  sounds are normal. There is no tenderness. There is no rebound.  Musculoskeletal: He exhibits no edema.       Right lower leg: He exhibits no tenderness and no edema.       Left lower leg: He exhibits no tenderness and no edema.  Lymphadenopathy:    He has no cervical adenopathy.  Neurological: He is alert and oriented to person, place, and time.  Skin: Skin is warm and dry. Abrasion:    Psychiatric: He has a normal mood and affect.  Nursing note and vitals reviewed.    ED Treatments / Results  Labs (all labs ordered are listed, but only abnormal results are displayed) Labs Reviewed  BASIC METABOLIC PANEL - Abnormal; Notable for the following components:      Result Value   Glucose, Bld 105 (*)    All other components within normal limits  CBC  I-STAT TROPONIN, ED  POCT I-STAT TROPONIN I    EKG None  ED ECG REPORT   Date: 11/19/2017  Rate: 86  Rhythm: normal sinus rhythm  QRS Axis: normal  Intervals: normal  ST/T Wave abnormalities: normal  Conduction Disutrbances:none  Narrative Interpretation:   Old EKG Reviewed: unchanged  I have personally reviewed the EKG tracing and agree with the computerized printout as noted.   Radiology Dg Chest 2 View  Result Date: 11/18/2017 CLINICAL DATA:  Chest pain. EXAM: CHEST - 2 VIEW COMPARISON:  T 2819 FINDINGS: The cardiomediastinal contours are normal. The lungs are clear. Pulmonary vasculature is normal. No consolidation, pleural effusion, or pneumothorax. No acute osseous abnormalities are seen. EKG leads overlie the thorax. IMPRESSION: Negative radiographs of the chest. Electronically Signed   By: Narda Rutherford M.D.   On: 11/18/2017 22:25    Procedures Procedures (including critical care time)  Medications Ordered in ED Medications - No data to display   Initial Impression / Assessment and Plan / ED Course  I have reviewed the triage vital signs and the nursing notes.  Pertinent labs & imaging results that were  available during my care of the patient were reviewed by me and considered in my medical decision making (see chart for details).     Patient presents with chest pain.  Fairly atypical.  Currently he is pain-free.  No other significant associated symptoms.  He is low risk for ACS.  EKG shows no signs of ischemia or arrhythmia.  Chest x-ray shows no evidence of pneumothorax or pneumonia.  He has no risk factors for PE and is PERC negative.  Etiology of his pain at this time is unclear; however, doubt acute emergent process.  We will have him follow-up closely with his primary physician.  He does report that previously he has been treated for musculoskeletal pain when he has had chest pain.  Will trial naproxen to see if this helps.  After history, exam, and medical workup I feel the patient  has been appropriately medically screened and is safe for discharge home. Pertinent diagnoses were discussed with the patient. Patient was given return precautions.   Final Clinical Impressions(s) / ED Diagnoses   Final diagnoses:  Atypical chest pain    ED Discharge Orders         Ordered    naproxen (NAPROSYN) 500 MG tablet  2 times daily     11/19/17 0017           Horton, Mayer Masker, MD 11/19/17 [phone]

## 2017-11-19 NOTE — Discharge Instructions (Signed)
You were seen today for chest pain.  Your work-up here is reassuring.  Your cardiac tests and your x-ray are negative.  Follow-up closely with your primary physician.  Trial naproxen to see if this will help with any of your ongoing pain.

## 2017-11-20 ENCOUNTER — Ambulatory Visit
Admission: RE | Admit: 2017-11-20 | Discharge: 2017-11-20 | Disposition: A | Discharge status: Home / Self Care | Payer: BLUE CROSS/BLUE SHIELD | Source: Ambulatory Visit | Attending: Otolaryngology | Admitting: Otolaryngology

## 2017-11-20 ENCOUNTER — Other Ambulatory Visit: Payer: Self-pay | Admitting: Otolaryngology

## 2017-11-20 DIAGNOSIS — J320 Chronic maxillary sinusitis: Secondary | ICD-10-CM

## 2018-04-23 DIAGNOSIS — G44219 Episodic tension-type headache, not intractable: Secondary | ICD-10-CM | POA: Diagnosis not present

## 2018-04-23 DIAGNOSIS — M9901 Segmental and somatic dysfunction of cervical region: Secondary | ICD-10-CM | POA: Diagnosis not present

## 2018-04-23 DIAGNOSIS — M9905 Segmental and somatic dysfunction of pelvic region: Secondary | ICD-10-CM | POA: Diagnosis not present

## 2018-06-22 DIAGNOSIS — G4733 Obstructive sleep apnea (adult) (pediatric): Secondary | ICD-10-CM | POA: Diagnosis not present

## 2018-07-02 DIAGNOSIS — J301 Allergic rhinitis due to pollen: Secondary | ICD-10-CM | POA: Diagnosis not present

## 2018-07-02 DIAGNOSIS — J029 Acute pharyngitis, unspecified: Secondary | ICD-10-CM | POA: Diagnosis not present

## 2018-07-14 DIAGNOSIS — B349 Viral infection, unspecified: Secondary | ICD-10-CM | POA: Diagnosis not present

## 2019-08-26 ENCOUNTER — Encounter: Payer: Self-pay | Admitting: Cardiology

## 2019-08-26 ENCOUNTER — Ambulatory Visit: Payer: 59 | Admitting: Cardiology

## 2019-08-26 ENCOUNTER — Telehealth: Payer: Self-pay | Admitting: Radiology

## 2019-08-26 ENCOUNTER — Other Ambulatory Visit: Payer: Self-pay

## 2019-08-26 VITALS — BP 114/71 | HR 90 | Ht 65.0 in | Wt 195.2 lb

## 2019-08-26 DIAGNOSIS — E669 Obesity, unspecified: Secondary | ICD-10-CM

## 2019-08-26 DIAGNOSIS — R002 Palpitations: Secondary | ICD-10-CM | POA: Diagnosis not present

## 2019-08-26 NOTE — Progress Notes (Signed)
Cardiology Office Note:    Date:  08/26/2019   ID:  Cameron Soto, DOB 08/25/1985, MRN [id number]  PCP:  Johny BlamerHarris, William, MD  Cardiologist:  No primary care provider on file.  Electrophysiologist:  None   Referring MD: Johny BlamerHarris, William, MD   Chief Complaint  Patient presents with  . Palpitations    History of Present Illness:    Cameron Soto is a 34 y.o. male with a hx of asthma, ADHD, allergic rhinitis, GERD, hyperlipidemia, obesity, OSA who is referred by Dr. Tiburcio PeaHarris for evaluation of palpitations.  Reports that he has had palpitations for years.  States that since December 2020 has been occurring more frequently, can happen multiple times per day.  However he recently was promoted to a new job and reports palpitations have been improving, now happening about once per day.  Episodes typically last a few seconds and resolve.  Describes as fluttering feeling in his chest.  States that he is not exercising regularly.  Denies any chest pain or dyspnea.  Does report some lightheadedness but denies any syncope.  He does not smoke cigarettes, but rarely will smoke a cigar.  Occasional alcohol use.  Family history includes maternal grandfather had MI at age 35 and maternal grandmother has CHF.  Labs on 08/10/2019 showed LDL 79, creatinine 0.78, albumin 4.3, hemoglobin 14.3, TSH 1.2.  In 2018, 30 day monitor showed sinus rhythm with rare PVCs.  Past Medical History:  Diagnosis Date  . Asthma   . Thyroid disease     No past surgical history on file.  Current Medications: Current Meds  Medication Sig  . acidophilus (RISAQUAD) CAPS capsule Take 1 capsule by mouth daily.  Marland Kitchen. albuterol (PROVENTIL HFA;VENTOLIN HFA) 108 (90 Base) MCG/ACT inhaler Inhale 1-2 puffs into the lungs every 6 (six) hours as needed for wheezing or shortness of breath.  . Ascorbic Acid (VITAMIN C) 1000 MG tablet Take 1,000 mg by mouth daily.    Marland Kitchen. co-enzyme Q-10 30 MG capsule Take 30 mg by mouth daily.  .  fexofenadine-pseudoephedrine (ALLEGRA-D 24) 180-240 MG 24 hr tablet Take 1 tablet by mouth daily.  . fluticasone (FLONASE) 50 MCG/ACT nasal spray Place 2 sprays into both nostrils daily as needed for allergies.   . Iron-Vitamins (GERITOL PO) Take by mouth.  . loratadine (CLARITIN) 10 MG tablet Take 10 mg by mouth daily.  . Multiple Vitamin (MULTIVITAMIN WITH MINERALS) TABS tablet Take 1 tablet by mouth daily.  Marland Kitchen. omeprazole (PRILOSEC) 40 MG capsule Take 40 mg by mouth daily.  . Pediatric Multivitamins-Fl (MULTI VIT/FL PO) Take by mouth.  . promethazine (PHENERGAN) 25 MG tablet Take 25 mg by mouth every 6 (six) hours as needed.  . Red Yeast Rice Extract (RED YEAST RICE PO) Take 1 tablet by mouth daily.  . [DISCONTINUED] metoprolol tartrate (LOPRESSOR) 25 MG tablet Take 1 tablet (25 mg total) by mouth daily.  . [DISCONTINUED] naproxen (NAPROSYN) 500 MG tablet Take 1 tablet (500 mg total) by mouth 2 (two) times daily.     Allergies:   Amoxicillin-pot clavulanate, Meloxicam, Other, Penicillins, and Xyzal [levocetirizine]   Social History   Socioeconomic History  . Marital status: Divorced    Spouse name: Not on file  . Number of children: Not on file  . Years of education: Not on file  . Highest education level: Not on file  Occupational History  . Not on file  Tobacco Use  . Smoking status: Never Smoker  . Smokeless tobacco: Never Used  Vaping  Use  . Vaping Use: Former  . Quit date: 10/15/2016  Substance and Sexual Activity  . Alcohol use: No  . Drug use: No  . Sexual activity: Not on file  Other Topics Concern  . Not on file  Social History Narrative  . Not on file   Social Determinants of Health   Financial Resource Strain:   . Difficulty of Paying Living Expenses:   Food Insecurity:   . Worried About Programme researcher, broadcasting/film/video in the Last Year:   . Barista in the Last Year:   Transportation Needs:   . Freight forwarder (Medical):   Marland Kitchen Lack of Transportation  (Non-Medical):   Physical Activity:   . Days of Exercise per Week:   . Minutes of Exercise per Session:   Stress:   . Feeling of Stress :   Social Connections:   . Frequency of Communication with Friends and Family:   . Frequency of Social Gatherings with Friends and Family:   . Attends Religious Services:   . Active Member of Clubs or Organizations:   . Attends Banker Meetings:   Marland Kitchen Marital Status:      Family History: The patient's family history includes Heart disease in his maternal grandfather; Hypertension in his mother.  ROS:   Please see the history of present illness.     All other systems reviewed and are negative.  EKGs/Labs/Other Studies Reviewed:    The following studies were reviewed today:   EKG:  EKG is ordered today.  The ekg ordered today demonstrates normal sinus rhythm, rate 73, no ST abnormalities  Recent Labs: No results found for requested labs within last 8760 hours.  Recent Lipid Panel No results found for: CHOL, TRIG, HDL, CHOLHDL, VLDL, LDLCALC, LDLDIRECT  Physical Exam:    VS:  BP 114/71   Pulse 90   Ht 5\' 5"  (1.651 m)   Wt 195 lb 3.2 oz (88.5 kg)   SpO2 98%   BMI 32.48 kg/m     Wt Readings from Last 3 Encounters:  08/26/19 195 lb 3.2 oz (88.5 kg)  04/17/17 189 lb (85.7 kg)  06/12/16 209 lb 6.4 oz (95 kg)     GEN: Well nourished, well developed in no acute distress HEENT: Normal NECK: No JVD; No carotid bruits LYMPHATICS: No lymphadenopathy CARDIAC: RRR, no murmurs, rubs, gallops RESPIRATORY:  Clear to auscultation without rales, wheezing or rhonchi  ABDOMEN: Soft, non-tender, non-distended MUSCULOSKELETAL:  No edema; No deformity  SKIN: Warm and dry NEUROLOGIC:  Alert and oriented x 3 PSYCHIATRIC:  Normal affect   ASSESSMENT:    1. Palpitations   2. Obesity (BMI 30-39.9)    PLAN:    Palpitations: Description concerning for arrhythmia, likely PACs/PVCs.  Will check Zio patch x7 days for further  evaluation.  Obesity: Body mass index is 32.48 kg/m.  Diet and exercise encouraged.  RTC in 3 months   Medication Adjustments/Labs and Tests Ordered: Current medicines are reviewed at length with the patient today.  Concerns regarding medicines are outlined above.  Orders Placed This Encounter  Procedures  . LONG TERM MONITOR (3-14 DAYS)  . EKG 12-Lead   No orders of the defined types were placed in this encounter.   Patient Instructions  Medication Instructions:  Your physician recommends that you continue on your current medications as directed. Please refer to the Current Medication list given to you today.  Testing/Procedures:  06/14/16- Long Term Monitor Instructions   Your  physician has requested you wear your ZIO patch monitor 7 days.   This is a single patch monitor.  [employer] supplies one patch monitor per enrollment.  Additional stickers are not available.   Please do not apply patch if you will be having a Nuclear Stress Test, Echocardiogram, Cardiac CT, MRI, or Chest Xray during the time frame you would be wearing the monitor. The patch cannot be worn during these tests.  You cannot remove and re-apply the ZIO XT patch monitor.   Your ZIO patch monitor will be sent USPS Priority mail from [facility] directly to your home address. The monitor may also be mailed to a PO BOX if home delivery is not available.   It may take 3-5 days to receive your monitor after you have been enrolled.   Once you have received you monitor, please review enclosed instructions.  Your monitor has already been registered assigning a specific monitor serial # to you.   Applying the monitor   Shave hair from upper left chest.   Hold abrader disc by orange tab.  Rub abrader in 40 strokes over left upper chest as indicated in your monitor instructions.   Clean area with 4 enclosed alcohol pads .  Use all pads to assure are is cleaned thoroughly.  Let dry.   Apply patch as indicated  in monitor instructions.  Patch will be place under collarbone on left side of chest with arrow pointing upward.   Rub patch adhesive wings for 2 minutes.Remove white label marked "1".  Remove white label marked "2".  Rub patch adhesive wings for 2 additional minutes.   While looking in a mirror, press and release button in center of patch.  A small green light will flash 3-4 times .  This will be your only indicator the monitor has been turned on.     Do not shower for the first 24 hours.  You may shower after the first 24 hours.   Press button if you feel a symptom. You will hear a small click.  Record Date, Time and Symptom in the Patient Log Book.   When you are ready to remove patch, follow instructions on last 2 pages of Patient Log Book.  Stick patch monitor onto last page of Patient Log Book.   Place Patient Log Book in [place] box.  Use locking tab on box and tape box closed securely.  The [employer] and [employer] has [employer] on it.  Please place in mailbox as soon as possible.  Your physician should have your test results approximately 7 days after the monitor has been mailed back to [facility].   Call [facility] Customer Care at [phone] if you have questions regarding your ZIO XT patch monitor.  Call them immediately if you see an orange light blinking on your monitor.   If your monitor falls off in less than 4 days contact our Monitor department at [phone].  If your monitor becomes loose or falls off after 4 days call [employer] at [phone] for suggestions on securing your monitor.   Follow-Up: At [facility], you and your health needs are our priority.  As part of our continuing mission to provide you with exceptional heart care, we have created designated Provider Care Teams.  These Care Teams include your primary Cardiologist (physician) and Advanced Practice Providers (APPs -  Physician Assistants and Nurse Practitioners) who all work together to provide  you with the care you need, when you need it.  We recommend signing up for the patient portal called "MyChart".  Sign up information is provided on this After Visit Summary.  MyChart is used to connect with patients for Virtual Visits (Telemedicine).  Patients are able to view lab/test results, encounter notes, upcoming appointments, etc.  Non-urgent messages can be sent to your provider as well.   To learn more about what you can do with MyChart, go to [email or web address].    Your next appointment:   3 month(s)  The format for your next appointment:   In Person  Provider:   Epifanio Lesches, MD        Signed, [name], MD  08/26/2019 7:49 PM    [facility]

## 2019-08-26 NOTE — Patient Instructions (Signed)
Medication Instructions:  Your physician recommends that you continue on your current medications as directed. Please refer to the Current Medication list given to you today.  Testing/Procedures:  Christena Deem- Long Term Monitor Instructions   Your physician has requested you wear your ZIO patch monitor 7 days.   This is a single patch monitor.  [employer] supplies one patch monitor per enrollment.  Additional stickers are not available.   Please do not apply patch if you will be having a Nuclear Stress Test, Echocardiogram, Cardiac CT, MRI, or Chest Xray during the time frame you would be wearing the monitor. The patch cannot be worn during these tests.  You cannot remove and re-apply the ZIO XT patch monitor.   Your ZIO patch monitor will be sent USPS Priority mail from [facility] directly to your home address. The monitor may also be mailed to a PO BOX if home delivery is not available.   It may take 3-5 days to receive your monitor after you have been enrolled.   Once you have received you monitor, please review enclosed instructions.  Your monitor has already been registered assigning a specific monitor serial # to you.   Applying the monitor   Shave hair from upper left chest.   Hold abrader disc by orange tab.  Rub abrader in 40 strokes over left upper chest as indicated in your monitor instructions.   Clean area with 4 enclosed alcohol pads .  Use all pads to assure are is cleaned thoroughly.  Let dry.   Apply patch as indicated in monitor instructions.  Patch will be place under collarbone on left side of chest with arrow pointing upward.   Rub patch adhesive wings for 2 minutes.Remove white label marked "1".  Remove white label marked "2".  Rub patch adhesive wings for 2 additional minutes.   While looking in a mirror, press and release button in center of patch.  A small green light will flash 3-4 times .  This will be your only indicator the monitor has been turned on.      Do not shower for the first 24 hours.  You may shower after the first 24 hours.   Press button if you feel a symptom. You will hear a small click.  Record Date, Time and Symptom in the Patient Log Book.   When you are ready to remove patch, follow instructions on last 2 pages of Patient Log Book.  Stick patch monitor onto last page of Patient Log Book.   Place Patient Log Book in [place] box.  Use locking tab on box and tape box closed securely.  The [employer] has [employer] on it.  Please place in mailbox as soon as possible.  Your physician should have your test results approximately 7 days after the monitor has been mailed back to [facility].   Call [facility] Customer Care at [phone] if you have questions regarding your ZIO XT patch monitor.  Call them immediately if you see an orange light blinking on your monitor.   If your monitor falls off in less than 4 days contact our Monitor department at [phone].  If your monitor becomes loose or falls off after 4 days call [employer] at [phone] for suggestions on securing your monitor.   Follow-Up: At [facility], you and your health needs are our priority.  As part of our continuing mission to provide you with exceptional heart care, we have created designated Provider Care Teams.  These Care Teams include your primary Cardiologist (physician) and Advanced Practice Providers (APPs -  Physician Assistants and Nurse Practitioners) who all work together to provide you with the care you need, when you need it.  We recommend signing up for the patient portal called "MyChart".  Sign up information is provided on this After Visit Summary.  MyChart is used to connect with patients for Virtual Visits (Telemedicine).  Patients are able to view lab/test results, encounter notes, upcoming appointments, etc.  Non-urgent messages can be sent to your provider as well.   To learn more about what you can do with MyChart, go to  [email or web address].    Your next appointment:   3 month(s)  The format for your next appointment:   In Person  Provider:   Christopher Schumann, MD     

## 2019-08-26 NOTE — Telephone Encounter (Signed)
Enrolled patient for a 7 day Zio monitor to be mailed to patients home.  

## 2019-09-12 ENCOUNTER — Ambulatory Visit (INDEPENDENT_AMBULATORY_CARE_PROVIDER_SITE_OTHER): Payer: 59

## 2019-09-12 DIAGNOSIS — R002 Palpitations: Secondary | ICD-10-CM | POA: Diagnosis not present

## 2019-11-29 ENCOUNTER — Ambulatory Visit: Payer: 59 | Admitting: Cardiology

## 2019-12-05 NOTE — Progress Notes (Signed)
Cardiology Office Note:    Date:  12/07/2019   ID:  Cameron Soto, DOB [birth year], MRN [id number]  PCP:  Johny Blamer, MD  Cardiologist:  No primary care provider on file.  Electrophysiologist:  None   Referring MD: Johny Blamer, MD   Chief Complaint  Patient presents with  . Palpitations    History of Present Illness:    Cameron Soto is a 34 y.o. male with a hx of asthma, ADHD, allergic rhinitis, GERD, hyperlipidemia, obesity, OSA who presents for follow-up.  He was referred by Dr. Tiburcio Pea for evaluation of palpitations, initially seen on 08/26/2019.  Reports that he has had palpitations for years.  States that since December 2020 has been occurring more frequently, can happen multiple times per day.  However he recently was promoted to a new job and reports palpitations have been improving, now happening about once per day.  Episodes typically last a few seconds and resolve.  Describes as fluttering feeling in his chest.  States that he is not exercising regularly.  Denies any chest pain or dyspnea.  Does report some lightheadedness but denies any syncope.  He does not smoke cigarettes, but rarely will smoke a cigar.  Occasional alcohol use.  Family history includes maternal grandfather had MI at age 44 and maternal grandmother has CHF.  Labs on 08/10/2019 showed LDL 79, creatinine 0.78, albumin 4.3, hemoglobin 14.3, TSH 1.2.  In 2018, 30 day monitor showed sinus rhythm with rare PVCs.  Zio monitor was ordered, he only wore for 1 day but no significant arrhythmias detected.  Since last clinic visit, he reports palpitations have improved.  Still occurs 3 to 4 days/week and can happen several times per day when it occurs.  Typically lasts for few seconds and resolves.  He was only able to wear his heart monitor for 1 day as had issues with monitor sticking to his chest.  He has not been exercising.  Denies any chest pain, dyspnea, or syncope.  Reports rare episodes of lightheadedness,  which has been attributed to vertigo.  No lower extremity edema.   Past Medical History:  Diagnosis Date  . Asthma   . Thyroid disease     No past surgical history on file.  Current Medications: Current Meds  Medication Sig  . acidophilus (RISAQUAD) CAPS capsule Take 1 capsule by mouth daily.  Marland Kitchen albuterol (PROVENTIL HFA;VENTOLIN HFA) 108 (90 Base) MCG/ACT inhaler Inhale 1-2 puffs into the lungs every 6 (six) hours as needed for wheezing or shortness of breath.  . Ascorbic Acid (VITAMIN C) 1000 MG tablet Take 1,000 mg by mouth daily.    Marland Kitchen co-enzyme Q-10 30 MG capsule Take 30 mg by mouth daily.  . fluticasone (FLONASE) 50 MCG/ACT nasal spray Place 2 sprays into both nostrils daily as needed for allergies.   Marland Kitchen loratadine (CLARITIN) 10 MG tablet Take 10 mg by mouth daily.  . Multiple Vitamin (MULTIVITAMIN WITH MINERALS) TABS tablet Take 1 tablet by mouth daily.  Marland Kitchen omeprazole (PRILOSEC) 40 MG capsule Take 40 mg by mouth daily.  . promethazine (PHENERGAN) 25 MG tablet Take 25 mg by mouth every 6 (six) hours as needed.  . Red Yeast Rice Extract (RED YEAST RICE PO) Take 1 tablet by mouth daily.     Allergies:   Amoxicillin-pot clavulanate, Meloxicam, Other, Penicillins, and Xyzal [levocetirizine]   Social History   Socioeconomic History  . Marital status: Divorced    Spouse name: Not on file  . Number of children: Not on  file  . Years of education: Not on file  . Highest education level: Not on file  Occupational History  . Not on file  Tobacco Use  . Smoking status: Never Smoker  . Smokeless tobacco: Never Used  Vaping Use  . Vaping Use: Former  . Quit date: 10/15/2016  Substance and Sexual Activity  . Alcohol use: No  . Drug use: No  . Sexual activity: Not on file  Other Topics Concern  . Not on file  Social History Narrative  . Not on file   Social Determinants of Health   Financial Resource Strain:   . Difficulty of Paying Living Expenses: Not on file  Food  Insecurity:   . Worried About Programme researcher, broadcasting/film/video in the Last Year: Not on file  . Ran Out of Food in the Last Year: Not on file  Transportation Needs:   . Lack of Transportation (Medical): Not on file  . Lack of Transportation (Non-Medical): Not on file  Physical Activity:   . Days of Exercise per Week: Not on file  . Minutes of Exercise per Session: Not on file  Stress:   . Feeling of Stress : Not on file  Social Connections:   . Frequency of Communication with Friends and Family: Not on file  . Frequency of Social Gatherings with Friends and Family: Not on file  . Attends Religious Services: Not on file  . Active Member of Clubs or Organizations: Not on file  . Attends Banker Meetings: Not on file  . Marital Status: Not on file     Family History: The patient's family history includes Heart disease in his maternal grandfather; Hypertension in his mother.  ROS:   Please see the history of present illness.     All other systems reviewed and are negative.  EKGs/Labs/Other Studies Reviewed:    The following studies were reviewed today:   EKG:  EKG is ordered today.  The ekg ordered today demonstrates normal sinus rhythm, rate 104, no ST abnormalities  Recent Labs: No results found for requested labs within last 8760 hours.  Recent Lipid Panel No results found for: CHOL, TRIG, HDL, CHOLHDL, VLDL, LDLCALC, LDLDIRECT  Physical Exam:    VS:  BP 104/82   Pulse (!) 104   Ht 5\' 6"  (1.676 m)   Wt 195 lb (88.5 kg)   SpO2 98%   BMI 31.47 kg/m     Wt Readings from Last 3 Encounters:  12/07/19 195 lb (88.5 kg)  08/26/19 195 lb 3.2 oz (88.5 kg)  04/17/17 189 lb (85.7 kg)     GEN: Well nourished, well developed in no acute distress HEENT: Normal NECK: No JVD; No carotid bruits LYMPHATICS: No lymphadenopathy CARDIAC: RRR, no murmurs, rubs, gallops RESPIRATORY:  Clear to auscultation without rales, wheezing or rhonchi  ABDOMEN: Soft, non-tender,  non-distended MUSCULOSKELETAL:  No edema; No deformity  SKIN: Warm and dry NEUROLOGIC:  Alert and oriented x 3 PSYCHIATRIC:  Normal affect   ASSESSMENT:    1. Palpitations   2. Lightheadedness   3. Obesity (BMI 30-39.9)    PLAN:    Palpitations: Description concerning for arrhythmia, likely PACs/PVCs.  Zio monitor was ordered, he only wore for 1 day but no significant arrhythmias detected.  Triggered events did seem to correspond to PVCs, though also occurred with normal sinus rhythm.  No further work-up at this time.  Lightheadedness: Reports intermittent lightheadedness, suspect vertigo but will check echocardiogram to rule out structural heart  disease  Obesity: Body mass index is 31.47 kg/m.  Diet and exercise encouraged.  RTC in 1 year   Medication Adjustments/Labs and Tests Ordered: Current medicines are reviewed at length with the patient today.  Concerns regarding medicines are outlined above.  Orders Placed This Encounter  Procedures  . EKG 12-Lead  . ECHOCARDIOGRAM COMPLETE   No orders of the defined types were placed in this encounter.   Patient Instructions  Medication Instructions:  Your physician recommends that you continue on your current medications as directed. Please refer to the Current Medication list given to you today.  Testing/Procedures: Your physician has requested that you have an echocardiogram. Echocardiography is a painless test that uses sound waves to create images of your heart. It provides your doctor with information about the size and shape of your heart and how well your heart's chambers and valves are working. This procedure takes approximately one hour. There are no restrictions for this procedure.  This will be done at our [facility] location:  [address]  Follow-Up: At [facility], you and your health needs are our priority.  As part of our continuing mission to provide you with exceptional heart care, we  have created designated Provider Care Teams.  These Care Teams include your primary Cardiologist (physician) and Advanced Practice Providers (APPs -  Physician Assistants and Nurse Practitioners) who all work together to provide you with the care you need, when you need it.  We recommend signing up for the patient portal called "MyChart".  Sign up information is provided on this After Visit Summary.  MyChart is used to connect with patients for Virtual Visits (Telemedicine).  Patients are able to view lab/test results, encounter notes, upcoming appointments, etc.  Non-urgent messages can be sent to your provider as well.   To learn more about what you can do with MyChart, go to [email or web address].    Your next appointment:   12 month(s)  The format for your next appointment:   In Person  Provider:   Epifanio Lesches, MD       Signed, [name], MD  12/07/2019 4:59 PM    [facility]

## 2019-12-07 ENCOUNTER — Other Ambulatory Visit: Payer: Self-pay

## 2019-12-07 ENCOUNTER — Ambulatory Visit: Payer: 59 | Admitting: Cardiology

## 2019-12-07 ENCOUNTER — Encounter: Payer: Self-pay | Admitting: Cardiology

## 2019-12-07 VITALS — BP 104/82 | HR 104 | Ht 66.0 in | Wt 195.0 lb

## 2019-12-07 DIAGNOSIS — R42 Dizziness and giddiness: Secondary | ICD-10-CM

## 2019-12-07 DIAGNOSIS — R002 Palpitations: Secondary | ICD-10-CM

## 2019-12-07 DIAGNOSIS — E669 Obesity, unspecified: Secondary | ICD-10-CM | POA: Diagnosis not present

## 2019-12-07 NOTE — Patient Instructions (Signed)
Medication Instructions:  Your physician recommends that you continue on your current medications as directed. Please refer to the Current Medication list given to you today.  Testing/Procedures: Your physician has requested that you have an echocardiogram. Echocardiography is a painless test that uses sound waves to create images of your heart. It provides your doctor with information about the size and shape of your heart and how well your heart's chambers and valves are working. This procedure takes approximately one hour. There are no restrictions for this procedure.  This will be done at our [address] location:  [address]  Follow-Up: At [facility], you and your health needs are our priority.  As part of our continuing mission to provide you with exceptional heart care, we have created designated Provider Care Teams.  These Care Teams include your primary Cardiologist (physician) and Advanced Practice Providers (APPs -  Physician Assistants and Nurse Practitioners) who all work together to provide you with the care you need, when you need it.  We recommend signing up for the patient portal called "MyChart".  Sign up information is provided on this After Visit Summary.  MyChart is used to connect with patients for Virtual Visits (Telemedicine).  Patients are able to view lab/test results, encounter notes, upcoming appointments, etc.  Non-urgent messages can be sent to your provider as well.   To learn more about what you can do with MyChart, go to [email or web address].    Your next appointment:   12 month(s)  The format for your next appointment:   In Person  Provider:   Christopher Schumann, MD    

## 2019-12-23 ENCOUNTER — Other Ambulatory Visit: Payer: Self-pay

## 2019-12-23 ENCOUNTER — Ambulatory Visit (HOSPITAL_COMMUNITY): Payer: 59 | Attending: Cardiology

## 2019-12-23 DIAGNOSIS — R002 Palpitations: Secondary | ICD-10-CM | POA: Insufficient documentation

## 2019-12-23 LAB — ECHOCARDIOGRAM COMPLETE
Area-P 1/2: 5.13 cm2
S' Lateral: 2.2 cm

## 2020-09-21 IMAGING — CR DG CHEST 2V
2 series · 2 of 2 positions shown · non-contrast
Comparison: T 4066

CLINICAL DATA: Chest pain.

EXAM:
CHEST - 2 VIEW

[w chest pa]
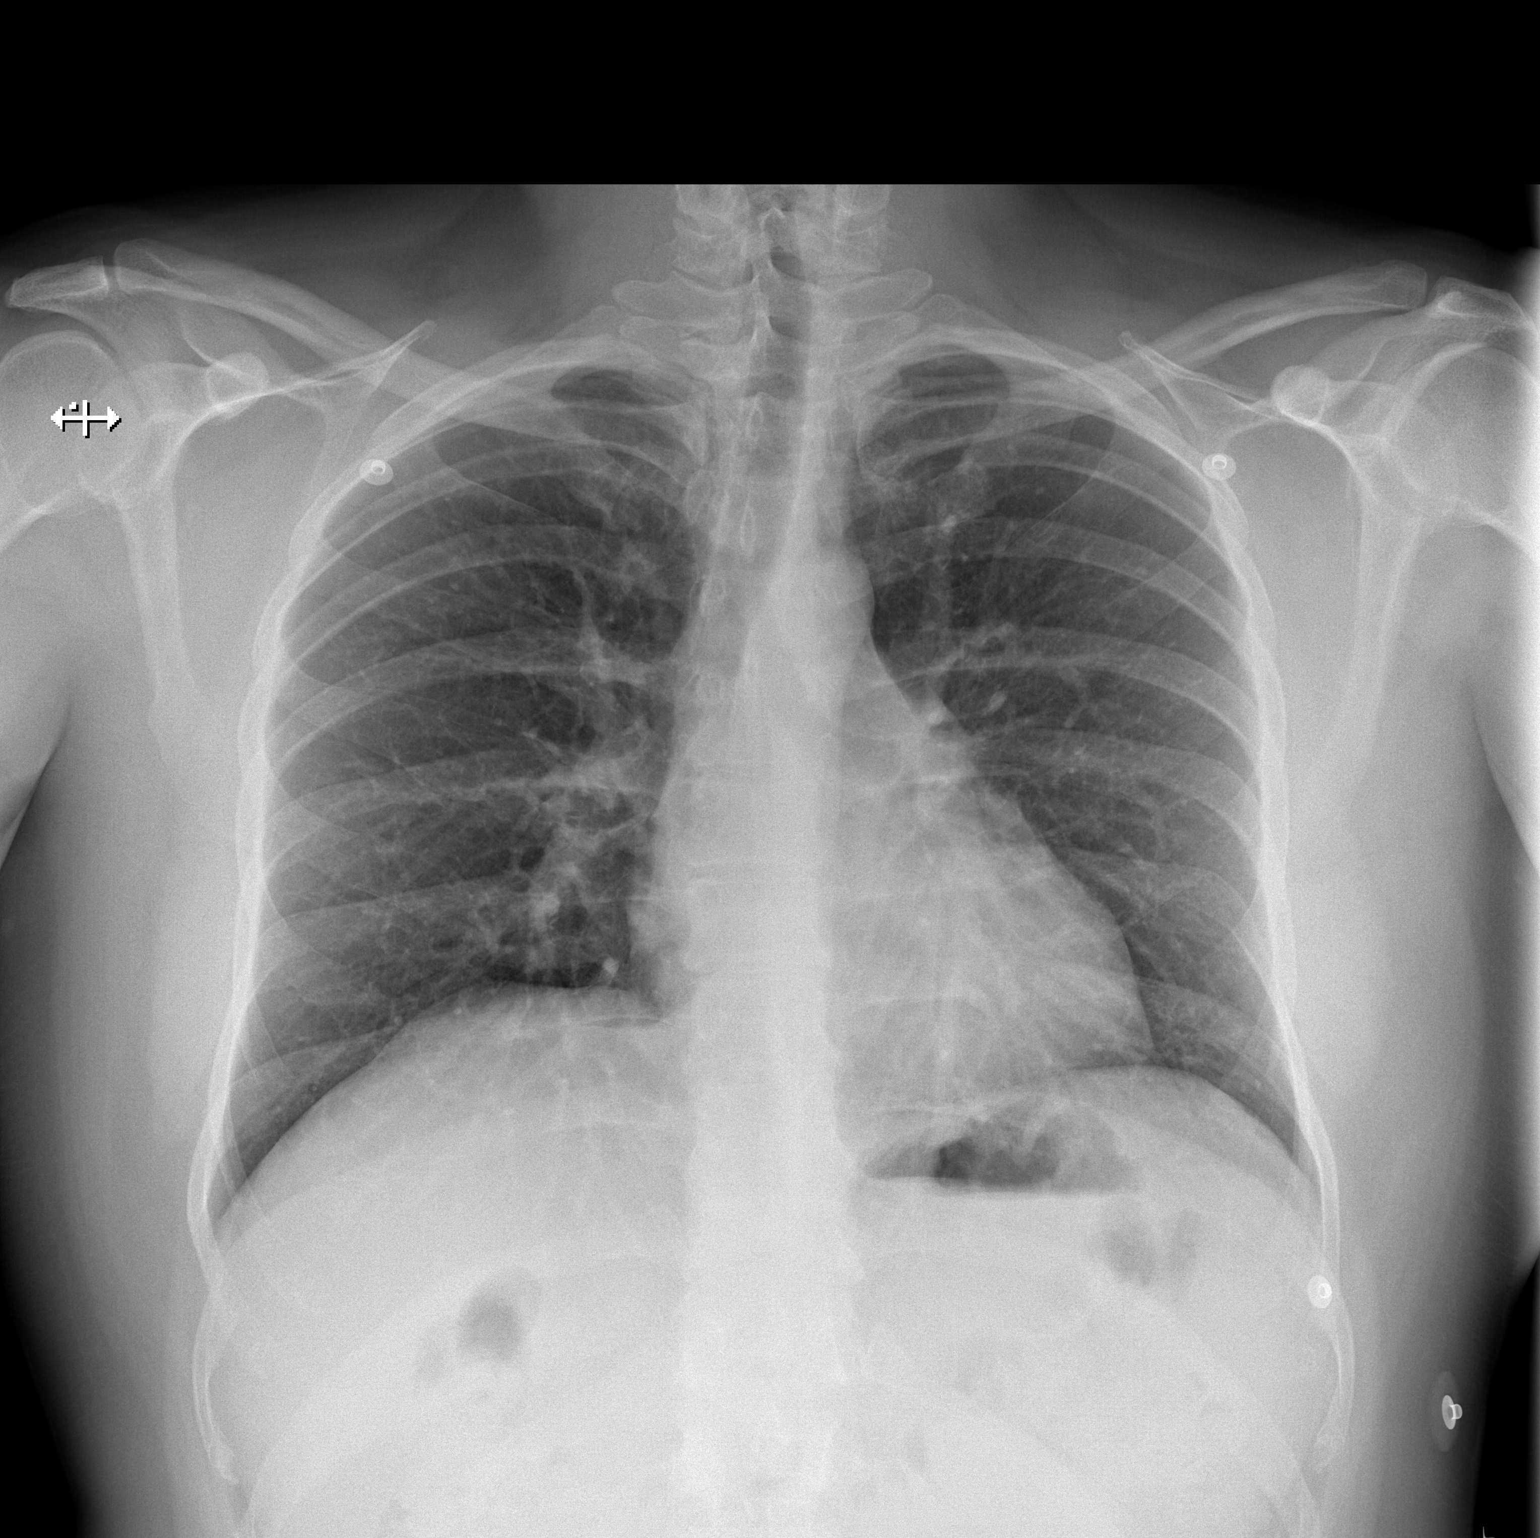

[w chest lat]
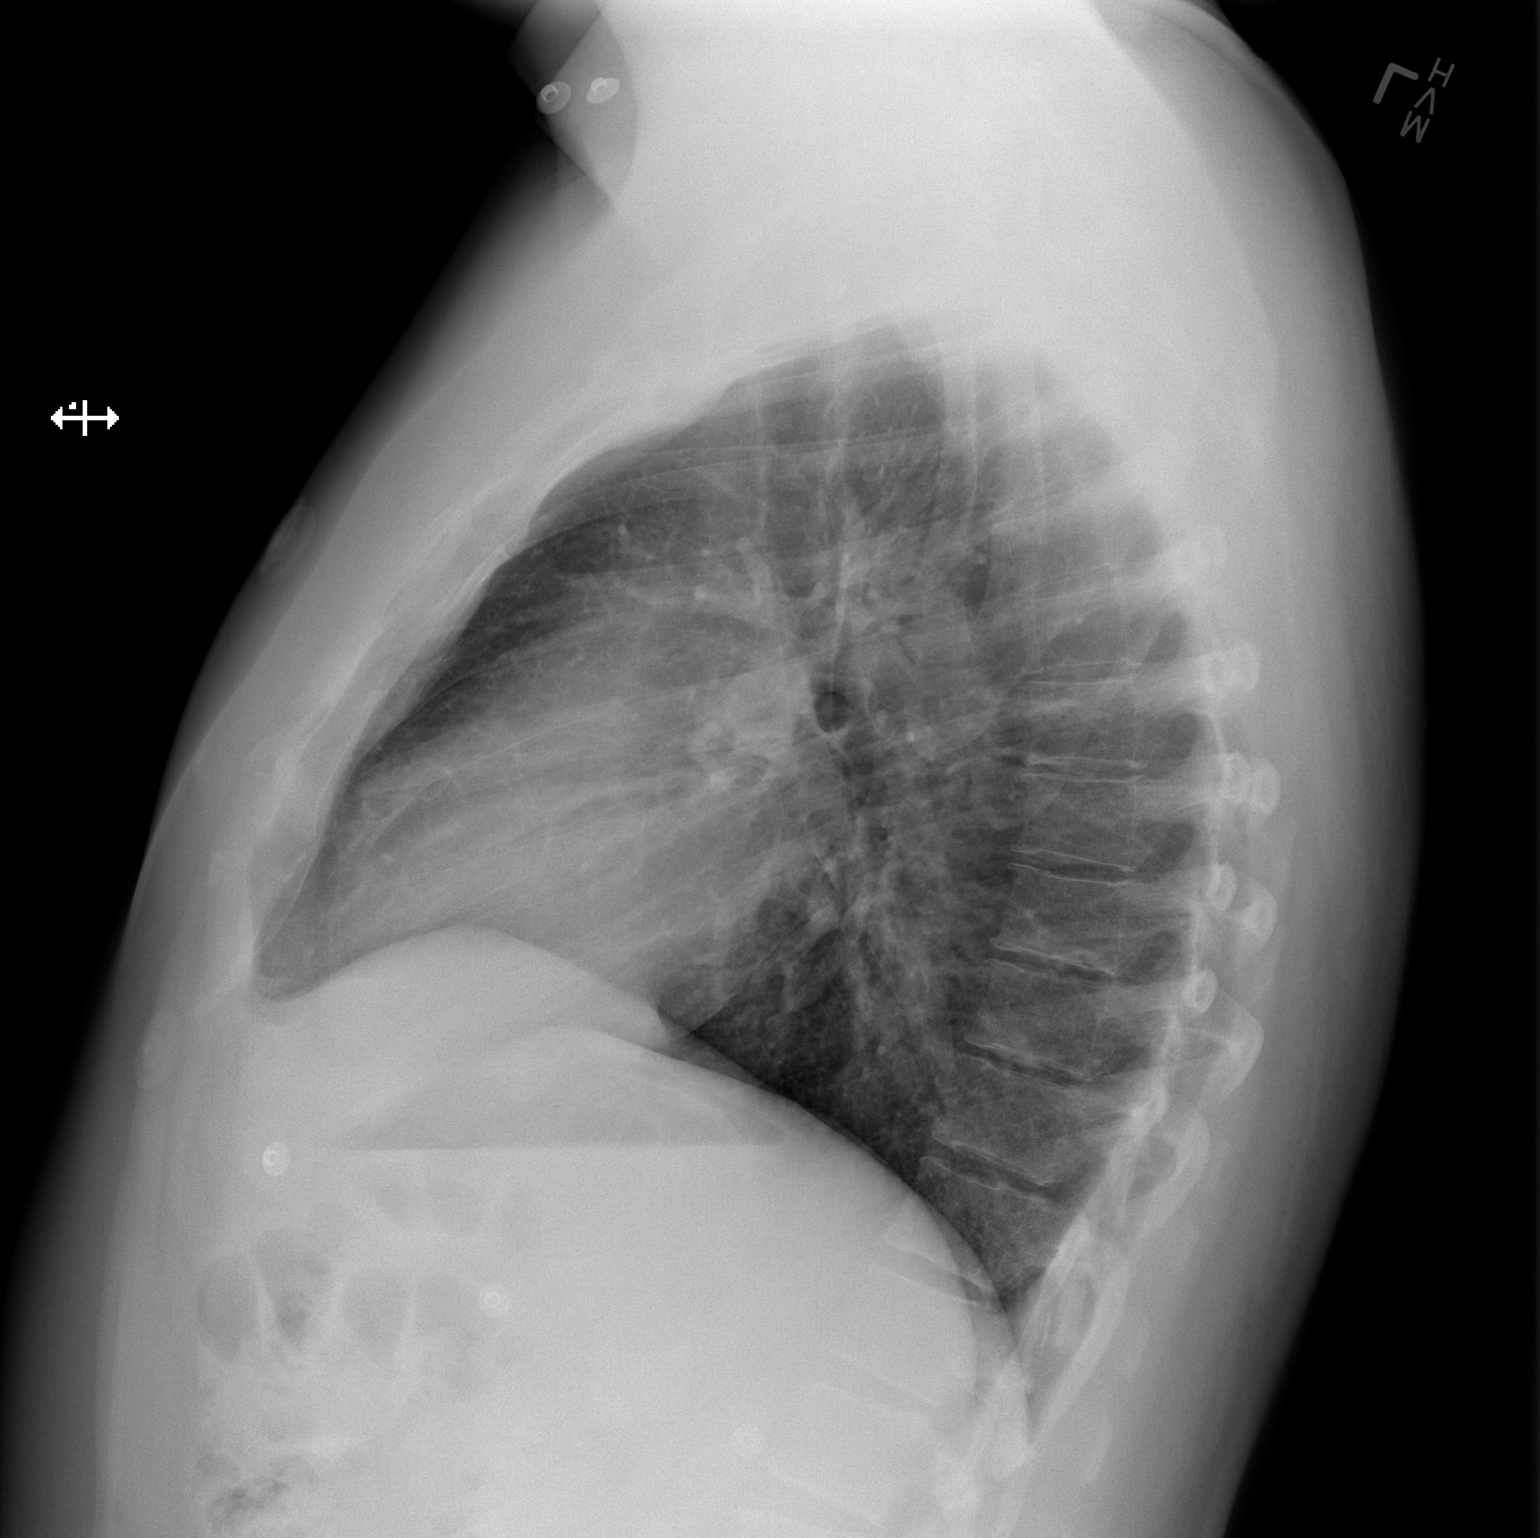

[2 of 2 positions shown; findings below may reference images not displayed]

FINDINGS: The cardiomediastinal contours are normal. The lungs are clear.
Pulmonary vasculature is normal. No consolidation, pleural effusion,
or pneumothorax. No acute osseous abnormalities are seen. EKG leads
overlie the thorax.
IMPRESSION: Negative radiographs of the chest.

## 2021-09-08 ENCOUNTER — Emergency Department (HOSPITAL_BASED_OUTPATIENT_CLINIC_OR_DEPARTMENT_OTHER)
Admission: EM | Admit: 2021-09-08 | Discharge: 2021-09-09 | Disposition: A | Discharge status: Home / Self Care | Payer: 59 | Attending: Emergency Medicine | Admitting: Emergency Medicine

## 2021-09-08 ENCOUNTER — Encounter (HOSPITAL_BASED_OUTPATIENT_CLINIC_OR_DEPARTMENT_OTHER): Payer: Self-pay

## 2021-09-08 ENCOUNTER — Other Ambulatory Visit: Payer: Self-pay

## 2021-09-08 DIAGNOSIS — M25512 Pain in left shoulder: Secondary | ICD-10-CM | POA: Diagnosis present

## 2021-09-08 DIAGNOSIS — J45909 Unspecified asthma, uncomplicated: Secondary | ICD-10-CM | POA: Diagnosis not present

## 2021-09-08 DIAGNOSIS — L042 Acute lymphadenitis of upper limb: Secondary | ICD-10-CM | POA: Diagnosis not present

## 2021-09-08 NOTE — ED Triage Notes (Signed)
Patient arrives POV from home c/o left shoulder pain; pt states "it feels tender" in his left axilla and states he thinks his lymph node is swollen. Pt reports pain is in left arm pit, shoulder, and radiates down left side. Pt recently recovering from Shingles (affected his right side). Pt unsure if he injured his left shoulder. Pt a&o x4.

## 2021-09-09 MED ORDER — AZITHROMYCIN 250 MG PO TABS: 250.0000 mg | ORAL_TABLET | Freq: Every day | ORAL | 0 refills | Status: AC

## 2021-09-09 NOTE — ED Provider Notes (Signed)
[facility] EMERGENCY DEPT Provider Note  CSN: [id number] Arrival date & time: 09/08/21 1944  Chief Complaint(s) Shoulder Pain  HPI Cameron Soto is a 36 y.o. male with no pertinent past medical history who presents to the emergency department with left axillary painful nodule which has been there for several days.  He denies any fall or trauma.  Pain now radiating up into his shoulder.  Patient endorses recently being treated for presumed shingles without rash on the right chest wall.  He did admit to having had a recent cat scratch approximately the 4 to 6 weeks ago.  No fevers or chills.  No no other infectious symptoms.  Patient was recently seen by his PCP earlier in the month and had reassuring CBC and other labs.  The history is provided by the patient.    Past Medical History Past Medical History:  Diagnosis Date   Asthma    Thyroid disease    Patient Active Problem List   Diagnosis Date Noted   Palpitations 05/01/2016   Home Medication(s) Prior to Admission medications   Medication Sig Start Date End Date Taking? Authorizing Provider  azithromycin (ZITHROMAX) 250 MG tablet Take 1 tablet (250 mg total) by mouth daily. Take first 2 tablets together, then 1 every day until finished. 09/09/21  Yes Cola Gane, Amadeo Garnet, MD  acidophilus (RISAQUAD) CAPS capsule Take 1 capsule by mouth daily.    [provider]  albuterol (PROVENTIL HFA;VENTOLIN HFA) 108 (90 Base) MCG/ACT inhaler Inhale 1-2 puffs into the lungs every 6 (six) hours as needed for wheezing or shortness of breath.    [provider]  Ascorbic Acid (VITAMIN C) 1000 MG tablet Take 1,000 mg by mouth daily.      [provider]  co-enzyme Q-10 30 MG capsule Take 30 mg by mouth daily.    [provider]  fluticasone (FLONASE) 50 MCG/ACT nasal spray Place 2 sprays into both nostrils daily as needed for allergies.  10/22/15   [provider]  loratadine (CLARITIN)  10 MG tablet Take 10 mg by mouth daily. 05/17/19   [provider]  Multiple Vitamin (MULTIVITAMIN WITH MINERALS) TABS tablet Take 1 tablet by mouth daily.    [provider]  omeprazole (PRILOSEC) 40 MG capsule Take 40 mg by mouth daily. 06/30/19   [provider]  promethazine (PHENERGAN) 25 MG tablet Take 25 mg by mouth every 6 (six) hours as needed. 08/16/19   [provider]  Red Yeast Rice Extract (RED YEAST RICE PO) Take 1 tablet by mouth daily.    [provider]                                                                                                                                    Allergies Amoxicillin-pot clavulanate, Meloxicam, Other, Penicillins, and Xyzal [levocetirizine]  Review of Systems Review of Systems As noted in HPI  Physical Exam Vital  Signs  I have reviewed the triage vital signs BP 126/84 (BP Location: Right Arm)   Pulse 76   Temp 98.5 F (36.9 C) (Oral)   Resp 18   Ht 5' 5.75" (1.67 m)   Wt 87.5 kg   SpO2 100%   BMI 31.39 kg/m   Physical Exam Vitals reviewed.  Constitutional:      General: He is not in acute distress.    Appearance: He is well-developed. He is not diaphoretic.  HENT:     Head: Normocephalic and atraumatic.     Right Ear: External ear normal.     Left Ear: External ear normal.     Nose: Nose normal.     Mouth/Throat:     Mouth: Mucous membranes are moist.  Eyes:     General: No scleral icterus.    Conjunctiva/sclera: Conjunctivae normal.  Neck:     Trachea: Phonation normal.  Cardiovascular:     Rate and Rhythm: Normal rate and regular rhythm.  Pulmonary:     Effort: Pulmonary effort is normal. No respiratory distress.     Breath sounds: No stridor.  Abdominal:     General: There is no distension.  Musculoskeletal:        General: Normal range of motion.       Hands:     Cervical back: Normal range of motion.  Lymphadenopathy:     Upper Body:     Right upper body: No  supraclavicular, axillary, pectoral or epitrochlear adenopathy.     Left upper body: Axillary adenopathy present. No supraclavicular, pectoral or epitrochlear adenopathy.  Neurological:     Mental Status: He is alert and oriented to person, place, and time.  Psychiatric:        Behavior: Behavior normal.     ED Results and Treatments Labs (all labs ordered are listed, but only abnormal results are displayed) Labs Reviewed - No data to display                                                                                                                       EKG  EKG Interpretation  Date/Time:    Ventricular Rate:    PR Interval:    QRS Duration:   QT Interval:    QTC Calculation:   R Axis:     Text Interpretation:         Radiology No results found.  Pertinent labs & imaging results that were available during my care of the patient were reviewed by me and considered in my medical decision making (see MDM for details).  Medications Ordered in ED Medications - No data to display  Procedures Procedures  (including critical care time)  Medical Decision Making / ED Course    Complexity of Problem:  Patient's presenting problem/concern, DDX, and MDM listed below: Left axillary painful mass Most suspicious for cat scratch disease Not consistent with abscess Given reassuring labs confirmed by record review I have low suspicion for leukemia/lymphoma. We will treat with a course of azithromycin PCP follow-up   Final Clinical Impression(s) / ED Diagnoses Final diagnoses:  Acute axillary lymphadenitis   The patient appears reasonably screened and/or stabilized for discharge and I doubt any other medical condition or other [facility] requiring further screening, evaluation, or treatment in the ED at this time prior to discharge. Safe for discharge  with strict return precautions.  Disposition: Discharge  Condition: Good  I have discussed the results, Dx and Tx plan with the patient/family who expressed understanding and agree(s) with the plan. Discharge instructions discussed at length. The patient/family was given strict return precautions who verbalized understanding of the instructions. No further questions at time of discharge.    ED Discharge Orders          Ordered    azithromycin (ZITHROMAX) 250 MG tablet  Daily        09/09/21 0112              Follow Up: Johny Blamer, MD [address] [place] [address] [phone]   to schedule an appointment for close follow up           This chart was dictated using voice recognition software.  Despite best efforts to proofread,  errors can occur which can change the documentation meaning.    Nira Conn, MD 09/09/21 [phone]

## 2021-12-24 ENCOUNTER — Telehealth: Payer: Self-pay | Admitting: Physician Assistant

## 2021-12-24 NOTE — Telephone Encounter (Signed)
Scheduled patient to see [name] on 12/25/21 at 3:35 pm

## 2021-12-25 ENCOUNTER — Ambulatory Visit: Payer: 59 | Attending: Physician Assistant

## 2021-12-25 ENCOUNTER — Ambulatory Visit: Payer: 59 | Attending: Physician Assistant | Admitting: Physician Assistant

## 2021-12-25 ENCOUNTER — Other Ambulatory Visit: Payer: Self-pay | Admitting: Physician Assistant

## 2021-12-25 ENCOUNTER — Ambulatory Visit: Payer: 59

## 2021-12-25 ENCOUNTER — Encounter: Payer: Self-pay | Admitting: Physician Assistant

## 2021-12-25 VITALS — BP 98/64 | HR 76 | Ht 65.0 in | Wt 195.2 lb

## 2021-12-25 DIAGNOSIS — M542 Cervicalgia: Secondary | ICD-10-CM

## 2021-12-25 DIAGNOSIS — R42 Dizziness and giddiness: Secondary | ICD-10-CM

## 2021-12-25 NOTE — Progress Notes (Unsigned)
Enrolled for [employer] to mail a ZIO XT long term holter monitor to the patients address on file.  ? ?Dr. Schumann to read. ?

## 2021-12-25 NOTE — Telephone Encounter (Signed)
Patient called and informed [employer] will be calling him to confirm a shipping address for his monitor.  Basic instructions provided as instruction booklet is in kit.  Patient aware monitor batter will need to be shipped back in same box to [employer].  They will process it an provide a report of the 14 day recording.

## 2021-12-25 NOTE — Progress Notes (Unsigned)
Cardiology Office Note:    Date:  12/27/2021   ID:  Cameron Soto, DOB June 02, 1985, MRN [id number]  PCP:  Shirline Frees, [facility] Cardiologist:  Donato Heinz, MD     Referring MD: Shirline Frees, MD   Chief Complaint  Patient presents with   Follow-up    Seen for Dr. Gardiner Rhyme    History of Present Illness:    Cameron Soto is a 36 y.o. male with a hx of asthma, ADHD, allergic rhinitis, GERD, hyperlipidemia, obesity and obstructive sleep apnea.  He was referred by PCP in July 2021 for evaluation of palpitation.  On first examination, patient reported palpitation for years.  Since December 2020, his palpitation has been occurring more frequently and can occur multiple times day.  He did wear a heart monitor, however he was only able to wear the heart monitor for 1 day as he had issues with monitor sticking to his chest.  He was last seen by Dr. Nechama Guard in October 2021 at which time he reported intermittent lightheadedness which Dr. Nechama Guard suspected was a vertigo.  He has heart monitor was reviewed, triggered events symptoms to correspond with PVCs but may also occurs during his normal sinus rhythm.  Echocardiogram obtained on 12/23/2019 showed EF 55 to 60%, grade 1 DD, trivial MR.  Patient presents today for evaluation of dizzy spell.  He described it more as imbalance episode.  He says there were several episodes where he felt like he was near fainting, however at the same time denies the symptom was never strong enough to make him fall down.  He continued to have occasional skipped heartbeat.  There was a episode of palpitation that occurred on September 16, he drank multiple coffee that day and took Excedrin.  He has not had any further palpitations since.  He was seen by urgent care that day, blood pressure was borderline elevated, heart rate was also mildly elevated as well.  I did not recommend any further work-up for the palpitation, however I am  concerned about his dizzy spell.  I have ordered a longer heart monitor.  Unfortunately patient was only able to wear the ZIO monitor for 1 day back in 2021, he tolerates the [employer] monitor better.  We will do a 2-week heart monitor.  If no significant arrhythmia to explain his symptoms, then no further work-up will be needed.  He can follow-up in 1 year.  Past Medical History:  Diagnosis Date   Asthma    Thyroid disease     History reviewed. No pertinent surgical history.  Current Medications: Current Meds  Medication Sig   albuterol (PROVENTIL HFA;VENTOLIN HFA) 108 (90 Base) MCG/ACT inhaler Inhale 1-2 puffs into the lungs every 6 (six) hours as needed for wheezing or shortness of breath.   Ascorbic Acid (VITAMIN C) 1000 MG tablet Take 1,000 mg by mouth daily.     Cholecalciferol (D 1000) 25 MCG (1000 UT) capsule Take by mouth.   co-enzyme Q-10 30 MG capsule Take 30 mg by mouth daily.   fluticasone (FLONASE) 50 MCG/ACT nasal spray Place 2 sprays into both nostrils daily as needed for allergies.    loratadine (CLARITIN) 10 MG tablet Take 10 mg by mouth daily.   Multiple Vitamin (MULTIVITAMIN WITH MINERALS) TABS tablet Take 1 tablet by mouth daily.   omeprazole (PRILOSEC) 40 MG capsule Take 40 mg by mouth daily.   promethazine (PHENERGAN) 25 MG tablet Take 25 mg by mouth every 6 (  six) hours as needed.   Red Yeast Rice Extract (RED YEAST RICE PO) Take 1 tablet by mouth daily.     Allergies:   Amoxicillin-pot clavulanate, Meloxicam, Other, Penicillins, and Xyzal [levocetirizine]   Social History   Socioeconomic History   Marital status: Divorced    Spouse name: Not on file   Number of children: Not on file   Years of education: Not on file   Highest education level: Not on file  Occupational History   Not on file  Tobacco Use   Smoking status: Never   Smokeless tobacco: Never  Vaping Use   Vaping Use: Former   Quit date: 10/15/2016  Substance and Sexual Activity   Alcohol  use: No   Drug use: No   Sexual activity: Not on file  Other Topics Concern   Not on file  Social History Narrative   Not on file   Social Determinants of Health   Financial Resource Strain: Not on file  Food Insecurity: Not on file  Transportation Needs: Not on file  Physical Activity: Not on file  Stress: Not on file  Social Connections: Not on file     Family History: The patient's family history includes Heart disease in his maternal grandfather; Hypertension in his mother.  ROS:   Please see the history of present illness.     All other systems reviewed and are negative.  EKGs/Labs/Other Studies Reviewed:    The following studies were reviewed today:  Echo 12/23/2019 1. Left ventricular ejection fraction, by estimation, is 55 to 60%. The  left ventricle has normal function. The left ventricle has no regional  wall motion abnormalities. Left ventricular diastolic parameters are  consistent with Grade I diastolic  dysfunction (impaired relaxation).   2. Right ventricular systolic function is normal. The right ventricular  size is normal.   3. The mitral valve is normal in structure. Trivial mitral valve  regurgitation.   4. The aortic valve is normal in structure. Aortic valve regurgitation is  not visualized.   EKG:  EKG is ordered today.  The ekg ordered today demonstrates normal sinus rhythm, no significant ST-T wave changes.  Recent Labs: No results found for requested labs within last 365 days.  Recent Lipid Panel No results found for: "CHOL", "TRIG", "HDL", "CHOLHDL", "VLDL", "LDLCALC", "LDLDIRECT"   Risk Assessment/Calculations:           Physical Exam:    VS:  BP 98/64 (BP Location: Left Arm, Patient Position: Sitting, Cuff Size: Large)   Pulse 76   Ht 5\' 5"  (1.651 m)   Wt 195 lb 3.2 oz (88.5 kg)   SpO2 96%   BMI 32.48 kg/m        Wt Readings from Last 3 Encounters:  12/25/21 195 lb 3.2 oz (88.5 kg)  09/08/21 193 lb (87.5 kg)  12/07/19  195 lb (88.5 kg)     GEN:  Well nourished, well developed in no acute distress HEENT: Normal NECK: No JVD; No carotid bruits LYMPHATICS: No lymphadenopathy CARDIAC: RRR, no murmurs, rubs, gallops RESPIRATORY:  Clear to auscultation without rales, wheezing or rhonchi  ABDOMEN: Soft, non-tender, non-distended MUSCULOSKELETAL:  No edema; No deformity  SKIN: Warm and dry NEUROLOGIC:  Alert and oriented x 3 PSYCHIATRIC:  Normal affect   ASSESSMENT:    1. Dizziness    PLAN:    In order of problems listed above:  Dizziness: We will proceed with 2-week [employer] monitor.  He has tried a ZIO monitor in  the past which did not stick to his chest very well.  If heart monitor shows no significant arrhythmia, no further work-up will be needed.           Medication Adjustments/Labs and Tests Ordered: Current medicines are reviewed at length with the patient today.  Concerns regarding medicines are outlined above.  Orders Placed This Encounter  Procedures   LONG TERM MONITOR (3-14 DAYS)   EKG 12-Lead   No orders of the defined types were placed in this encounter.   Patient Instructions  Medication Instructions:  Your physician recommends that you continue on your current medications as directed. Please refer to the Current Medication list given to you today.  *If you need a refill on your cardiac medications before your next appointment, please call your pharmacy*  Lab Work: NONE ordered at this time of appointment   If you have labs (blood work) drawn today and your tests are completely normal, you will receive your results only by: [place] (if you have MyChart) OR A paper copy in the mail If you have any lab test that is abnormal or we need to change your treatment, we will call you to review the results.  Testing/Procedures: [name], PA-C has ordered for you to wear a [employer] monitor for 14 days.   Follow-Up: At [facility], you and your health  needs are our priority.  As part of our continuing mission to provide you with exceptional heart care, we have created designated Provider Care Teams.  These Care Teams include your primary Cardiologist (physician) and Advanced Practice Providers (APPs -  Physician Assistants and Nurse Practitioners) who all work together to provide you with the care you need, when you need it.  Your next appointment:   1 year(s)  The format for your next appointment:   In Person  Provider:   Donato Heinz, MD     Other Instructions  Important Information About Sugar         Signed, [name], [place]  12/27/2021 12:01 AM    [place]

## 2021-12-25 NOTE — Patient Instructions (Addendum)
Medication Instructions:  Your physician recommends that you continue on your current medications as directed. Please refer to the Current Medication list given to you today.  *If you need a refill on your cardiac medications before your next appointment, please call your pharmacy*  Lab Work: NONE ordered at this time of appointment   If you have labs (blood work) drawn today and your tests are completely normal, you will receive your results only by: [place] (if you have MyChart) OR A paper copy in the mail If you have any lab test that is abnormal or we need to change your treatment, we will call you to review the results.  Testing/Procedures: [name], PA-C has ordered for you to wear a [employer] monitor for 14 days.   Follow-Up: At [facility], you and your health needs are our priority.  As part of our continuing mission to provide you with exceptional heart care, we have created designated Provider Care Teams.  These Care Teams include your primary Cardiologist (physician) and Advanced Practice Providers (APPs -  Physician Assistants and Nurse Practitioners) who all work together to provide you with the care you need, when you need it.  Your next appointment:   1 year(s)  The format for your next appointment:   In Person  Provider:   Donato Heinz, MD     Other Instructions  Important Information About Sugar

## 2021-12-26 ENCOUNTER — Encounter: Payer: Self-pay | Admitting: Physician Assistant

## 2021-12-26 NOTE — Progress Notes (Unsigned)
Enrolled for [employer] to ship a long term holter monitor to the patients address on file.   Dr.Schumann to read.

## 2021-12-28 DIAGNOSIS — R42 Dizziness and giddiness: Secondary | ICD-10-CM | POA: Diagnosis not present

## 2022-01-18 ENCOUNTER — Encounter: Payer: Self-pay | Admitting: Physician Assistant

## 2022-01-19 ENCOUNTER — Other Ambulatory Visit: Payer: 59

## 2022-02-04 ENCOUNTER — Encounter (HOSPITAL_BASED_OUTPATIENT_CLINIC_OR_DEPARTMENT_OTHER): Payer: Self-pay | Admitting: Cardiology

## 2022-02-04 NOTE — Progress Notes (Signed)
Normal sinus rhythm, no significant irregular rhythm. Some PVCs which may cause the patient to feel skipped heart beat. But nothing on the monitor should cause Cameron Soto to feel near passing out spells.

## 2022-12-23 NOTE — Progress Notes (Unsigned)
Cardiology Clinic Note   Patient Name: Cameron Soto Date of Encounter: 12/25/2022  Primary Care Provider:  Noberto Retort, MD Primary Cardiologist:  [name], MD  Patient Profile    Nikolas Casher 37 year old male presents to the clinic today for follow-up evaluation of his palpitations.  Past Medical History    Past Medical History:  Diagnosis Date   Asthma    Thyroid disease    History reviewed. No pertinent surgical history.  Allergies  Allergies  Allergen Reactions   Amoxicillin-Pot Clavulanate Hives   Meloxicam Nausea Only   Other Other (See Comments)    shellfish   Penicillins Hives   Xyzal [Levocetirizine] Palpitations    History of Present Illness    Jamespaul Secrist has a PMH of dizziness, asthma, ADHD, allergic rhinitis, hyperlipidemia, GERD, obesity, OSA, and palpitations.  He was initially referred to cardiology by his PCP 7/21 for evaluation of his palpitations.  He reported that he had been noticing palpitations for several years.  He was seen by Dr. Bjorn Pippin 10/21.  During that time he reported intermittent periods of lightheadedness.  Vertigo was suspected.  He wore a cardiac event monitor.  It showed PVCs and normal sinus rhythm.  His triggered events corresponded with his PVCs.  His echocardiogram 12/23/2019 showed an EF of 55-60%, G1 DD, trivial mitral valve regurgitation.  He was seen in follow-up by Azalee Course PA-C on 12/25/2021.  During that time he reported dizziness.  He described imbalance type episodes.  He reported several episodes where he felt like he might faint.  However, the episodes were not strong enough to make him feel like he may fall down.  He continued to note occasional skipped heartbeats.  He reported drinking multiple cups of coffee the day of the last episode and also taking Excedrin.  He had not had any further palpitations since the episode 2 months prior.  He had also been seen in the urgent care the same day  for elevated blood pressure.  His heart rate was also mildly elevated.  Due to his dizziness a cardiac event monitor was ordered.  It showed PVCs and normal sinus rhythm.  Follow-up was planned for 1 year.  He presents to the clinic today for follow-up evaluation and states he notes occasional increased work of breathing with increased physical activity.  He has noticed a decrease in his palpitations with decreased caffeine.  His EKG today shows sinus tachycardia at 104 bpm.  He notes when he is physically active and exercising regularly that his heart rate is lower.  He has been tracking his blood pressure.  He has noticed some blood pressures elevated over the 140s systolic at home occasionally.  His blood pressure today is 112/82.  He has also noticed over the last couple years that he is experiencing some erectile dysfunction.  We reviewed option for coronary calcium scoring.  I will provide him with instructions on the test.  I encouraged increased/regular physical activity and heart healthy low-sodium high-fiber diet.  Will plan follow-up in 12 months.  He denies exertional chest pain.    Home Medications    Prior to Admission medications   Medication Sig Start Date End Date Taking? Authorizing Provider  acidophilus (RISAQUAD) CAPS capsule Take 1 capsule by mouth daily. Patient not taking: Reported on 12/25/2021    [provider]  albuterol (PROVENTIL HFA;VENTOLIN HFA) 108 (90 Base) MCG/ACT inhaler Inhale 1-2 puffs into the lungs every 6 (six) hours as needed for  wheezing or shortness of breath.    [provider]  Ascorbic Acid (VITAMIN C) 1000 MG tablet Take 1,000 mg by mouth daily.      [provider]  azithromycin (ZITHROMAX) 250 MG tablet Take 1 tablet (250 mg total) by mouth daily. Take first 2 tablets together, then 1 every day until finished. Patient not taking: Reported on 12/25/2021 09/09/21   Nira Conn, MD  Cholecalciferol (D 1000) 25 MCG (1000  UT) capsule Take by mouth.    [provider]  co-enzyme Q-10 30 MG capsule Take 30 mg by mouth daily.    [provider]  fluticasone (FLONASE) 50 MCG/ACT nasal spray Place 2 sprays into both nostrils daily as needed for allergies.  10/22/15   [provider]  loratadine (CLARITIN) 10 MG tablet Take 10 mg by mouth daily. 05/17/19   [provider]  Multiple Vitamin (MULTIVITAMIN WITH MINERALS) TABS tablet Take 1 tablet by mouth daily.    [provider]  omeprazole (PRILOSEC) 40 MG capsule Take 40 mg by mouth daily. 06/30/19   [provider]  promethazine (PHENERGAN) 25 MG tablet Take 25 mg by mouth every 6 (six) hours as needed. 08/16/19   [provider]  Red Yeast Rice Extract (RED YEAST RICE PO) Take 1 tablet by mouth daily.    [provider]    Family History    Family History  Problem Relation Age of Onset   Hypertension Mother    Heart disease Maternal Grandfather    He indicated that his mother is alive. He indicated that his father is alive. He indicated that his maternal grandmother is alive. He indicated that his maternal grandfather is alive. He indicated that his paternal grandmother is alive. He indicated that his paternal grandfather is alive.  Social History    Social History   Socioeconomic History   Marital status: Divorced    Spouse name: Not on file   Number of children: Not on file   Years of education: Not on file   Highest education level: Not on file  Occupational History   Not on file  Tobacco Use   Smoking status: Never   Smokeless tobacco: Never  Vaping Use   Vaping status: Former   Quit date: 10/15/2016  Substance and Sexual Activity   Alcohol use: No   Drug use: No   Sexual activity: Not on file  Other Topics Concern   Not on file  Social History Narrative   Not on file   Social Determinants of Health   Financial Resource Strain: Not on file  Food Insecurity: Not on file   Transportation Needs: Not on file  Physical Activity: Not on file  Stress: Not on file  Social Connections: Not on file  Intimate Partner Violence: Not on file     Review of Systems    General:  No chills, fever, night sweats or weight changes.  Cardiovascular:  No chest pain, dyspnea on exertion, edema, orthopnea, palpitations, paroxysmal nocturnal dyspnea. Dermatological: No rash, lesions/masses Respiratory: No cough, dyspnea Urologic: No hematuria, dysuria Abdominal:   No nausea, vomiting, diarrhea, bright red blood per rectum, melena, or hematemesis Neurologic:  No visual changes, wkns, changes in mental status. All other systems reviewed and are otherwise negative except as noted above.  Physical Exam    VS:  BP 112/82 (BP Location: Left Arm, Patient Position: Sitting, Cuff Size: Small)   Pulse (!) 104   Ht 5\' 6"  (1.676 m)  Wt 202 lb 6.4 oz (91.8 kg)   SpO2 97%   BMI 32.67 kg/m  , BMI Body mass index is 32.67 kg/m. GEN: Well nourished, well developed, in no acute distress. HEENT: normal. Neck: Supple, no JVD, carotid bruits, or masses. Cardiac: RRR, no murmurs, rubs, or gallops. No clubbing, cyanosis, edema.  Radials/DP/PT 2+ and equal bilaterally.  Respiratory:  Respirations regular and unlabored, clear to auscultation bilaterally. GI: Soft, nontender, nondistended, BS + x 4. MS: no deformity or atrophy. Skin: warm and dry, no rash. Neuro:  Strength and sensation are intact. Psych: Normal affect.  Accessory Clinical Findings    Recent Labs: No results found for requested labs within last 365 days.   Recent Lipid Panel No results found for: "CHOL", "TRIG", "HDL", "CHOLHDL", "VLDL", "LDLCALC", "LDLDIRECT"       ECG personally reviewed by me today- EKG Interpretation Date/Time:  Wednesday December 25 2022 08:14:10 EST Ventricular Rate:  104 PR Interval:  140 QRS Duration:  94 QT Interval:  318 QTC Calculation: 418 R Axis:   100  Text  Interpretation: Sinus tachycardia Rightward axis When compared with ECG of 18-Nov-2017 23:36, PREVIOUS ECG IS PRESENT Confirmed by Edd Fabian [phone]) on 12/25/2022 8:16:54 AM   Cardiac event monitor 01/17/2022    No significant arrhythmias   Patient triggered events appear to correspond to PVCs.  Overall PVC burden is low (less than 1%)   13 days of data recorded on [employer] monitor. Patient had a min HR of 42 bpm, max HR of 152 bpm, and avg HR of 84 bpm. Predominant underlying rhythm was Sinus Rhythm. No VT, SVT, atrial fibrillation, high degree block, or pauses noted. Isolated atrial and ventricular ectopy was rare (<1%). There were 40 triggered events, corresponding to sinus rhythm with PVCs.  No significant arrhythmias detected.  Echocardiogram 12/23/2019  IMPRESSIONS     1. Left ventricular ejection fraction, by estimation, is 55 to 60%. The  left ventricle has normal function. The left ventricle has no regional  wall motion abnormalities. Left ventricular diastolic parameters are  consistent with Grade I diastolic  dysfunction (impaired relaxation).   2. Right ventricular systolic function is normal. The right ventricular  size is normal.   3. The mitral valve is normal in structure. Trivial mitral valve  regurgitation.   4. The aortic valve is normal in structure. Aortic valve regurgitation is  not visualized.   FINDINGS   Left Ventricle: Left ventricular ejection fraction, by estimation, is 55  to 60%. The left ventricle has normal function. The left ventricle has no  regional wall motion abnormalities. The left ventricular internal cavity  size was normal in size. There is   no left ventricular hypertrophy. Left ventricular diastolic parameters  are consistent with Grade I diastolic dysfunction (impaired relaxation).   Right Ventricle: The right ventricular size is normal. No increase in  right ventricular wall thickness. Right ventricular systolic function is   normal.   Left Atrium: Left atrial size was normal in size.   Right Atrium: Right atrial size was normal in size.   Pericardium: There is no evidence of pericardial effusion.   Mitral Valve: The mitral valve is normal in structure. Trivial mitral  valve regurgitation.   Tricuspid Valve: The tricuspid valve is normal in structure. Tricuspid  valve regurgitation is trivial.   Aortic Valve: The aortic valve is normal in structure. Aortic valve  regurgitation is not visualized.   Pulmonic Valve: The pulmonic valve was grossly normal. Pulmonic valve  regurgitation is not visualized.   Aorta: The aortic root and ascending aorta are structurally normal, with  no evidence of dilitation.   IAS/Shunts: No atrial level shunt detected by color flow Doppler.          Assessment & Plan   1.  Dizziness-stable.  Previously wore a cardiac event monitor which showed PVCs and normal sinus rhythm.  Details above.  EKG today shows sinus tachycardia 104 bpm. Avoid triggers caffeine, chocolate, EtOH, dehydration etc. Increase physical activity as tolerated Heart healthy low-sodium diet  Hyperlipidemia-LDL 98 on 09/05/22. High-fiber diet Continue  co-Q10 Increase physical activity as tolerated Follows with PCP Offered coronary calcium scoring-testing information given   Disposition: Follow-up with Dr. Bjorn Pippin or me in 12 months.   Thomasene Ripple. Hadli Vandemark NP-C     12/25/2022, 9:07 AM [facility] [address] Office [phone] Fax [phone]    I spent 15 minutes examining this patient, reviewing medications, and using patient centered shared decision making involving her cardiac care.   I spent greater than 20 minutes reviewing her past medical history,  medications, and prior cardiac tests.

## 2022-12-25 ENCOUNTER — Encounter: Payer: Self-pay | Admitting: General Practice

## 2022-12-25 ENCOUNTER — Ambulatory Visit: Payer: Managed Care, Other (non HMO) | Attending: General Practice | Admitting: General Practice

## 2022-12-25 VITALS — BP 112/82 | HR 104 | Ht 66.0 in | Wt 202.4 lb

## 2022-12-25 DIAGNOSIS — R002 Palpitations: Secondary | ICD-10-CM

## 2022-12-25 DIAGNOSIS — R42 Dizziness and giddiness: Secondary | ICD-10-CM | POA: Diagnosis not present

## 2022-12-25 DIAGNOSIS — E782 Mixed hyperlipidemia: Secondary | ICD-10-CM

## 2022-12-25 NOTE — Patient Instructions (Signed)
Medication Instructions:  The current medical regimen is effective;  continue present plan and medications as directed. Please refer to the Current Medication list given to you today. *If you need a refill on your cardiac medications before your next appointment, please call your pharmacy*  Lab Work: NONE  Testing/Procedures: Other Instructions DECREASE THE CAFFEON IN YOUR DIET LOW SALT DIET CALCIUM SCORE TESTING IS $150-PLEASE CALL IF YOU DECIDE TO HAVE THIS DONE INCREASE YOUR EXERCISE TO 150 MINUTES WEEKLY  Follow-Up: At [facility], you and your health needs are our priority.  As part of our continuing mission to provide you with exceptional heart care, we have created designated Provider Care Teams.  These Care Teams include your primary Cardiologist (physician) and Advanced Practice Providers (APPs -  Physician Assistants and Nurse Practitioners) who all work together to provide you with the care you need, when you need it.  Your next appointment:   12 month(s)  Provider:   [name], MD  or Cameron Fabian, FNP        Coronary Calcium Scan A coronary calcium scan is an imaging test used to look for deposits of plaque in the inner lining of the blood vessels of the heart (coronary arteries). Plaque is made up of calcium, protein, and fatty substances. These deposits of plaque can partly clog and narrow the coronary arteries without producing any symptoms or warning signs. This puts a person at risk for a heart attack. A coronary calcium scan is performed using a computed tomography (CT) scanner machine without using a dye (contrast). This test is recommended for people who are at moderate risk for heart disease. The test can find plaque deposits before symptoms develop. Tell a health care provider about: Any allergies you have. All medicines you are taking, including vitamins, herbs, eye drops, creams, and over-the-counter medicines. Any problems you or family  members have had with anesthetic medicines. Any bleeding problems you have. Any surgeries you have had. Any medical conditions you have. Whether you are pregnant or may be pregnant. What are the risks? Generally, this is a safe procedure. However, problems may occur, including: Harm to a pregnant woman and her unborn baby. This test involves the use of radiation. Radiation exposure can be dangerous to a pregnant woman and her unborn baby. If you are pregnant or think you may be pregnant, you should not have this procedure done. A slight increase in the risk of cancer. This is because of the radiation involved in the test. The amount of radiation from one test is similar to the amount of radiation you are naturally exposed to over one year. What happens before the procedure? Ask your health care provider for any specific instructions on how to prepare for this procedure. You may be asked to avoid products that contain caffeine, tobacco, or nicotine for 4 hours before the procedure. What happens during the procedure?  You will undress and remove any jewelry from your neck or chest. You may need to remove hearing aides and dentures. Women may need to remove their bras. You will put on a hospital gown. Sticky electrodes will be placed on your chest. The electrodes will be connected to an electrocardiogram (ECG) machine to record a tracing of the electrical activity of your heart. You will lie down on your back on a curved bed that is attached to the CT scanner. You may be given medicine to slow down your heart rate so that clear pictures can be created. You  will be moved into the CT scanner, and the CT scanner will take pictures of your heart. During this time, you will be asked to lie still and hold your breath for 10-20 seconds at a time while each picture of your heart is being taken. The procedure may vary among health care providers and hospitals. What can I expect after the procedure? You can  return to your normal activities. It is up to you to get the results of your procedure. Ask your health care provider, or the department that is doing the procedure, when your results will be ready. Summary A coronary calcium scan is an imaging test used to look for deposits of plaque in the inner lining of the blood vessels of the heart. Plaque is made up of calcium, protein, and fatty substances. A coronary calcium scan is performed using a CT scanner machine without contrast. Generally, this is a safe procedure. Tell your health care provider if you are pregnant or may be pregnant. Ask your health care provider for any specific instructions on how to prepare for this procedure. You can return to your normal activities after the scan is done. This information is not intended to replace advice given to you by your health care provider. Make sure you discuss any questions you have with your health care provider. Document Revised: 01/14/2021 Document Reviewed: 01/14/2021 Elsevier Patient Education  2024 [employer].

## 2023-10-13 ENCOUNTER — Other Ambulatory Visit: Payer: Self-pay | Admitting: Medical Genetics

## 2023-10-13 ENCOUNTER — Encounter: Payer: Self-pay | Admitting: Cardiology

## 2023-10-16 NOTE — Telephone Encounter (Signed)
 Agree with bringing him in for appointment, looks like scheduled 9/9

## 2023-10-26 NOTE — Progress Notes (Unsigned)
 Cardiology Office Note:    Date:  10/28/2023   ID:  Cameron Soto, DOB Mar 15, 1985, MRN [id number]  PCP:  Arloa Elsie SAUNDERS, MD  Cardiologist:  Lonni LITTIE Nanas, MD  Electrophysiologist:  None   Referring MD: Arloa Elsie SAUNDERS, MD   Chief Complaint  Patient presents with   Palpitations    History of Present Illness:    Cameron Soto is a 38 y.o. male with a hx of asthma, ADHD, allergic rhinitis, GERD, hyperlipidemia, obesity, OSA who presents for follow-up.  He was referred by Dr. Arloa for evaluation of palpitations, initially seen on 08/26/2019.    In 2018, 30 day monitor showed sinus rhythm with rare PVCs.  Zio monitor was ordered 09/2019, he only wore for 1 day but no significant arrhythmias detected.  Echocardiogram 12/2019 showed EF 55 to 60%, normal RV function, no significant valve disease.  Zio patch x 13 days 12/2021 showed no significant arrhythmias, patient triggered events appear to correspond to PVCs but overall PVC burden is low (less than 1%).  Since last clinic visit, he reports he has been having palpitations.  Particularly notices it when he is stressed.  Can occur multiple times each hour.  Symptoms last for just a few seconds.  He stopped caffeine intake.  He denies any chest pain or dyspnea.  Does report some lightheadedness during episodes.   Past Medical History:  Diagnosis Date   Asthma    Thyroid disease     No past surgical history on file.  Current Medications: Current Meds  Medication Sig   acidophilus (RISAQUAD) CAPS capsule Take 1 capsule by mouth daily.   albuterol (PROVENTIL HFA;VENTOLIN HFA) 108 (90 Base) MCG/ACT inhaler Inhale 1-2 puffs into the lungs every 6 (six) hours as needed for wheezing or shortness of breath.   Ascorbic Acid (VITAMIN C) 1000 MG tablet Take 1,000 mg by mouth daily.     Cholecalciferol (D 1000) 25 MCG (1000 UT) capsule Take by mouth.   co-enzyme Q-10 30 MG capsule Take 30 mg by mouth daily.   fluticasone  (FLONASE) 50 MCG/ACT nasal spray Place 2 sprays into both nostrils daily as needed for allergies.    loratadine (CLARITIN) 10 MG tablet Take 10 mg by mouth daily.   metoprolol  tartrate (LOPRESSOR ) 25 MG tablet Take 1/2 tablet ( 12.5 mg ) twice a day as needed for palpitations   Multiple Vitamin (MULTIVITAMIN WITH MINERALS) TABS tablet Take 1 tablet by mouth daily.   omeprazole (PRILOSEC) 40 MG capsule Take 20 mg by mouth daily.   promethazine (PHENERGAN) 25 MG tablet Take 25 mg by mouth every 6 (six) hours as needed.     Allergies:   Amoxicillin-pot clavulanate, Meloxicam, Other, Penicillins, and Xyzal [levocetirizine]   Social History   Socioeconomic History   Marital status: Divorced    Spouse name: Not on file   Number of children: Not on file   Years of education: Not on file   Highest education level: Not on file  Occupational History   Not on file  Tobacco Use   Smoking status: Never   Smokeless tobacco: Never  Vaping Use   Vaping status: Former   Quit date: 10/15/2016  Substance and Sexual Activity   Alcohol use: No   Drug use: No   Sexual activity: Not on file  Other Topics Concern   Not on file  Social History Narrative   Not on file   Social Drivers of Health   Financial Resource Strain: Not on  file  Food Insecurity: Not on file  Transportation Needs: Not on file  Physical Activity: Not on file  Stress: Not on file  Social Connections: Not on file     Family History: The patient's family history includes Heart disease in his maternal grandfather; Hypertension in his mother.  ROS:   Please see the history of present illness.     All other systems reviewed and are negative.  EKGs/Labs/Other Studies Reviewed:    The following studies were reviewed today:   EKG:   10/28/2023: Sinus tachycardia, rate 101, no ST abnormalities  Recent Labs: No results found for requested labs within last 365 days.  Recent Lipid Panel No results found for: CHOL, TRIG,  HDL, CHOLHDL, VLDL, LDLCALC, LDLDIRECT  Physical Exam:    VS:  BP 118/80   Pulse (!) 101   Ht 5' 5 (1.651 m)   Wt 205 lb (93 kg)   SpO2 97%   BMI 34.11 kg/m     Wt Readings from Last 3 Encounters:  10/28/23 205 lb (93 kg)  12/25/22 202 lb 6.4 oz (91.8 kg)  12/25/21 195 lb 3.2 oz (88.5 kg)     GEN: Well nourished, well developed in no acute distress HEENT: Normal NECK: No JVD; No carotid bruits LYMPHATICS: No lymphadenopathy CARDIAC: RRR, no murmurs, rubs, gallops RESPIRATORY:  Clear to auscultation without rales, wheezing or rhonchi  ABDOMEN: Soft, non-tender, non-distended MUSCULOSKELETAL:  No edema; No deformity  SKIN: Warm and dry NEUROLOGIC:  Alert and oriented x 3 PSYCHIATRIC:  Normal affect   ASSESSMENT:    1. Palpitations   2. Mixed hyperlipidemia   3. Lightheadedness   4. Obesity (BMI 30.0-34.9)     PLAN:    Palpitations: Description concerning for arrhythmia, likely PACs/PVCs.  Zio monitor was ordered 09/2019, he only wore for 1 day but no significant arrhythmias detected.  Echocardiogram 12/2019 showed EF 55 to 60%, normal RV function, no significant valve disease.  Zio patch x 13 days 12/2021 showed no significant arrhythmias, patient triggered events appear to correspond to PVCs but overall PVC burden is low (less than 1%). - Palpitations appear to correspond to PVCs.  Appears triggered by stress.  Will prescribe Lopressor  12.5 mg twice daily as needed  Hyperlipidemia: LDL 92, triglycerides 260 on 09/15/2023.  Check calcium score to guide aggressive to be in lowering cholesterol  Lightheadedness: Reports intermittent lightheadedness, suspect vertigo.  Echocardiogram 12/2019 with no structural heart disease as above.  Obesity: Body mass index is 34.11 kg/m.  Diet and exercise encouraged.  RTC in 6 months   Medication Adjustments/Labs and Tests Ordered: Current medicines are reviewed at length with the patient today.  Concerns regarding  medicines are outlined above.  Orders Placed This Encounter  Procedures   CT CARDIAC SCORING (SELF PAY ONLY)   EKG 12-Lead   Meds ordered this encounter  Medications   metoprolol  tartrate (LOPRESSOR ) 25 MG tablet    Sig: Take 1/2 tablet ( 12.5 mg ) twice a day as needed for palpitations    Dispense:  90 tablet    Refill:  3    Patient Instructions  Medication Instructions:  Start Metoprolol  Tartrate 25 mg take 1/2 tablet twice a day as needed for palpitations Continue all other medications *If you need a refill on your cardiac medications before your next appointment, please call your pharmacy*  Lab Work: None ordered  Testing/Procedures: Calcium Score     scheduler will call back with appointment   Follow-Up: At [facility], you and your health needs are our priority.  As part of our continuing mission to provide you with exceptional heart care, our providers are all part of one team.  This team includes your primary Cardiologist (physician) and Advanced Practice Providers or APPs (Physician Assistants and Nurse Practitioners) who all work together to provide you with the care you need, when you need it.  Your next appointment:  6 months   Call in Nov to schedule  March appointment     Provider:  Dr.Din Bookwalter    We recommend signing up for the patient portal called MyChart.  Sign up information is provided on this After Visit Summary.  MyChart is used to connect with patients for Virtual Visits (Telemedicine).  Patients are able to view lab/test results, encounter notes, upcoming appointments, etc.  Non-urgent messages can be sent to your provider as well.   To learn more about what you can do with MyChart, go to [email or web address].        Signed, Lonni LITTIE Nanas, MD  10/28/2023 5:33 PM     [facility]

## 2023-10-28 ENCOUNTER — Ambulatory Visit: Attending: Cardiology | Admitting: Cardiology

## 2023-10-28 ENCOUNTER — Encounter: Payer: Self-pay | Admitting: Cardiology

## 2023-10-28 VITALS — BP 118/80 | HR 101 | Ht 65.0 in | Wt 205.0 lb

## 2023-10-28 DIAGNOSIS — R42 Dizziness and giddiness: Secondary | ICD-10-CM | POA: Insufficient documentation

## 2023-10-28 DIAGNOSIS — E782 Mixed hyperlipidemia: Secondary | ICD-10-CM | POA: Diagnosis not present

## 2023-10-28 DIAGNOSIS — E66811 Obesity, class 1: Secondary | ICD-10-CM | POA: Diagnosis not present

## 2023-10-28 DIAGNOSIS — R002 Palpitations: Secondary | ICD-10-CM | POA: Insufficient documentation

## 2023-10-28 MED ORDER — METOPROLOL TARTRATE 25 MG PO TABS: ORAL_TABLET | ORAL | 3 refills | Status: AC

## 2023-10-28 NOTE — Patient Instructions (Signed)
 Medication Instructions:  Start Metoprolol  Tartrate 25 mg take 1/2 tablet twice a day as needed for palpitations Continue all other medications *If you need a refill on your cardiac medications before your next appointment, please call your pharmacy*  Lab Work: None ordered  Testing/Procedures: Calcium Score     scheduler will call back with appointment   Follow-Up: At [facility], you and your health needs are our priority.  As part of our continuing mission to provide you with exceptional heart care, our providers are all part of one team.  This team includes your primary Cardiologist (physician) and Advanced Practice Providers or APPs (Physician Assistants and Nurse Practitioners) who all work together to provide you with the care you need, when you need it.  Your next appointment:  6 months   Call in Nov to schedule  March appointment     Provider:  Dr.Schumann    We recommend signing up for the patient portal called MyChart.  Sign up information is provided on this After Visit Summary.  MyChart is used to connect with patients for Virtual Visits (Telemedicine).  Patients are able to view lab/test results, encounter notes, upcoming appointments, etc.  Non-urgent messages can be sent to your provider as well.   To learn more about what you can do with MyChart, go to [email or web address].

## 2023-10-29 ENCOUNTER — Other Ambulatory Visit: Payer: Self-pay

## 2023-10-29 DIAGNOSIS — Z006 Encounter for examination for normal comparison and control in clinical research program: Secondary | ICD-10-CM

## 2023-11-04 ENCOUNTER — Encounter (INDEPENDENT_AMBULATORY_CARE_PROVIDER_SITE_OTHER): Payer: Self-pay

## 2023-11-07 LAB — GENECONNECT MOLECULAR SCREEN: Genetic Analysis Overall Interpretation: NEGATIVE

## 2023-11-19 ENCOUNTER — Encounter: Payer: Self-pay | Admitting: Cardiology

## 2023-12-02 ENCOUNTER — Ambulatory Visit: Payer: Self-pay | Admitting: Cardiology

## 2023-12-02 ENCOUNTER — Ambulatory Visit (HOSPITAL_BASED_OUTPATIENT_CLINIC_OR_DEPARTMENT_OTHER)
Admission: RE | Admit: 2023-12-02 | Discharge: 2023-12-02 | Disposition: A | Discharge status: Home / Self Care | Payer: Self-pay | Source: Ambulatory Visit | Attending: Cardiology | Admitting: Cardiology

## 2023-12-02 DIAGNOSIS — E782 Mixed hyperlipidemia: Secondary | ICD-10-CM | POA: Insufficient documentation

## 2023-12-03 ENCOUNTER — Ambulatory Visit (INDEPENDENT_AMBULATORY_CARE_PROVIDER_SITE_OTHER): Admitting: Physician Assistant

## 2023-12-03 ENCOUNTER — Encounter: Payer: Self-pay | Admitting: Physician Assistant

## 2023-12-03 VITALS — BP 145/89 | HR 97

## 2023-12-03 DIAGNOSIS — Z872 Personal history of diseases of the skin and subcutaneous tissue: Secondary | ICD-10-CM | POA: Diagnosis not present

## 2023-12-03 DIAGNOSIS — L818 Other specified disorders of pigmentation: Secondary | ICD-10-CM | POA: Diagnosis not present

## 2023-12-03 DIAGNOSIS — L209 Atopic dermatitis, unspecified: Secondary | ICD-10-CM | POA: Diagnosis not present

## 2023-12-03 DIAGNOSIS — L309 Dermatitis, unspecified: Secondary | ICD-10-CM

## 2023-12-03 MED ORDER — TRIAMCINOLONE ACETONIDE 0.1 % EX OINT: 1.0000 | TOPICAL_OINTMENT | Freq: Two times a day (BID) | CUTANEOUS | 4 refills | Status: DC | PRN

## 2023-12-03 MED ORDER — TRIAMCINOLONE ACETONIDE 0.1 % EX OINT: 1.0000 | TOPICAL_OINTMENT | Freq: Two times a day (BID) | CUTANEOUS | 2 refills | Status: AC | PRN

## 2023-12-03 NOTE — Progress Notes (Signed)
   New Patient Visit   Subjective  Cameron Soto is a 38 y.o. male NEW PATIENT who presents for the following: spot check  Patient states he  has a few spots  located on his ear, lips and right arm that he  would like to have examined.   Concerns: Rash on right proximal forearm that was vesiculated and became infected. Now gone but is wondering what it might have been. Area on left ear was previously treated with liquid nitrogen and he believes it is gone. Brown spot on lower lip that his previous dermatologist was watching. Also has hand eczema/dermatitis - worse in Winter time that is well controlled with triamcinolone.    The following portions of the chart were reviewed this encounter and updated as appropriate: medications, allergies, medical history  Review of Systems:  No other skin or systemic complaints except as noted in HPI or Assessment and Plan.  Objective  Well appearing patient in no apparent distress; mood and affect are within normal limits.   A focused examination was performed of the following areas: Arms, face, ears and hair/scalp   Relevant exam findings are noted in the Assessment and Plan.    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 It is often associated with allergic rhinitis and/or asthma and can require treatment with topical medications, phototherapy, or in severe cases biologic injectable medication (Dupixent; Adbry) or Oral JAK inhibitors.  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  I, Doyce Pan, CMA, am acting as scribe for Zaydee Aina K, PA-C.   Documentation: I have reviewed the above documentation for accuracy and completeness, and I agree with the above.  Jacobey Gura K, PA-C

## 2023-12-03 NOTE — Patient Instructions (Signed)

## 2024-04-06 ENCOUNTER — Ambulatory Visit: Admitting: Physician Assistant
